# Patient Record
Sex: Male | Born: 1945 | Hispanic: No | Marital: Married | Smoking: Former smoker
Health system: Southern US, Community
[De-identification: ages and names within clinical notes are randomized; demographics above are authoritative.]

## PROBLEM LIST (undated history)

## (undated) DIAGNOSIS — E785 Hyperlipidemia, unspecified: Secondary | ICD-10-CM

## (undated) DIAGNOSIS — I8390 Asymptomatic varicose veins of unspecified lower extremity: Secondary | ICD-10-CM

## (undated) DIAGNOSIS — K635 Polyp of colon: Secondary | ICD-10-CM

## (undated) DIAGNOSIS — K219 Gastro-esophageal reflux disease without esophagitis: Secondary | ICD-10-CM

## (undated) DIAGNOSIS — K2970 Gastritis, unspecified, without bleeding: Secondary | ICD-10-CM

## (undated) DIAGNOSIS — K5790 Diverticulosis of intestine, part unspecified, without perforation or abscess without bleeding: Secondary | ICD-10-CM

## (undated) DIAGNOSIS — R51 Headache: Secondary | ICD-10-CM

## (undated) DIAGNOSIS — H3530 Unspecified macular degeneration: Secondary | ICD-10-CM

## (undated) DIAGNOSIS — I2510 Atherosclerotic heart disease of native coronary artery without angina pectoris: Secondary | ICD-10-CM

## (undated) HISTORY — PX: CARDIAC CATHETERIZATION: SHX172

## (undated) HISTORY — DX: Gastritis, unspecified, without bleeding: K29.70

## (undated) HISTORY — DX: Atherosclerotic heart disease of native coronary artery without angina pectoris: I25.10

## (undated) HISTORY — DX: Asymptomatic varicose veins of unspecified lower extremity: I83.90

## (undated) HISTORY — PX: OTHER SURGICAL HISTORY: SHX169

## (undated) HISTORY — DX: Diverticulosis of intestine, part unspecified, without perforation or abscess without bleeding: K57.90

## (undated) HISTORY — DX: Polyp of colon: K63.5

## (undated) HISTORY — PX: CORONARY ARTERY BYPASS GRAFT: SHX141

## (undated) HISTORY — DX: Hyperlipidemia, unspecified: E78.5

---

## 2001-04-18 LAB — HM COLONOSCOPY

## 2001-04-19 ENCOUNTER — Encounter: Payer: Self-pay | Admitting: Internal Medicine

## 2001-04-19 ENCOUNTER — Ambulatory Visit: Admission: RE | Admit: 2001-04-19 | Discharge: 2001-04-19 | Payer: Self-pay | Admitting: Internal Medicine

## 2001-10-19 ENCOUNTER — Encounter: Payer: Self-pay | Admitting: Emergency Medicine

## 2001-10-19 ENCOUNTER — Emergency Department (HOSPITAL_COMMUNITY): Admission: EM | Admit: 2001-10-19 | Discharge: 2001-10-19 | Payer: Self-pay | Admitting: Emergency Medicine

## 2002-06-12 ENCOUNTER — Encounter: Admission: RE | Admit: 2002-06-12 | Discharge: 2002-06-27 | Payer: Self-pay | Admitting: Internal Medicine

## 2002-11-24 ENCOUNTER — Encounter: Admission: RE | Admit: 2002-11-24 | Discharge: 2002-11-24 | Payer: Self-pay | Admitting: Internal Medicine

## 2002-11-24 ENCOUNTER — Encounter: Payer: Self-pay | Admitting: Internal Medicine

## 2004-07-04 ENCOUNTER — Encounter: Admission: RE | Admit: 2004-07-04 | Discharge: 2004-07-04 | Payer: Self-pay | Admitting: Internal Medicine

## 2004-07-08 ENCOUNTER — Encounter: Admission: RE | Admit: 2004-07-08 | Discharge: 2004-10-06 | Payer: Self-pay | Admitting: Internal Medicine

## 2004-08-12 ENCOUNTER — Encounter: Payer: Self-pay | Admitting: Internal Medicine

## 2004-08-15 ENCOUNTER — Encounter: Admission: RE | Admit: 2004-08-15 | Discharge: 2004-08-15 | Payer: Self-pay | Admitting: Internal Medicine

## 2004-10-30 ENCOUNTER — Ambulatory Visit: Payer: Self-pay | Admitting: Internal Medicine

## 2004-11-26 ENCOUNTER — Ambulatory Visit: Payer: Self-pay | Admitting: Internal Medicine

## 2005-10-09 ENCOUNTER — Ambulatory Visit: Payer: Self-pay | Admitting: Internal Medicine

## 2005-10-16 ENCOUNTER — Ambulatory Visit: Payer: Self-pay

## 2005-10-29 ENCOUNTER — Ambulatory Visit: Payer: Self-pay | Admitting: Internal Medicine

## 2005-12-01 ENCOUNTER — Ambulatory Visit: Payer: Self-pay | Admitting: Internal Medicine

## 2005-12-04 ENCOUNTER — Encounter: Admission: RE | Admit: 2005-12-04 | Discharge: 2005-12-04 | Payer: Self-pay | Admitting: Internal Medicine

## 2006-08-05 ENCOUNTER — Ambulatory Visit: Payer: Self-pay | Admitting: Internal Medicine

## 2006-08-11 ENCOUNTER — Ambulatory Visit: Payer: Self-pay | Admitting: Internal Medicine

## 2006-09-07 ENCOUNTER — Ambulatory Visit: Payer: Self-pay | Admitting: Internal Medicine

## 2006-12-21 DIAGNOSIS — K635 Polyp of colon: Secondary | ICD-10-CM

## 2006-12-21 HISTORY — DX: Polyp of colon: K63.5

## 2006-12-21 HISTORY — PX: COLONOSCOPY W/ POLYPECTOMY: SHX1380

## 2008-01-13 ENCOUNTER — Ambulatory Visit: Payer: Self-pay | Admitting: Internal Medicine

## 2008-01-14 LAB — CONVERTED CEMR LAB
Alkaline Phosphatase: 48 units/L (ref 39–117)
Basophils Absolute: 0 10*3/uL (ref 0.0–0.1)
Bilirubin, Direct: 0.1 mg/dL (ref 0.0–0.3)
Direct LDL: 199.2 mg/dL
Eosinophils Relative: 2.1 % (ref 0.0–5.0)
HDL: 39.1 mg/dL (ref >39.0)
Lymphocytes Relative: 35.5 % (ref 12.0–46.0)
MCV: 86.7 fL (ref 78.0–100.0)
Monocytes Absolute: 0.7 10*3/uL (ref 0.2–0.7)
Monocytes Relative: 8.8 % (ref 3.0–11.0)
Neutro Abs: 4.5 10*3/uL (ref 1.4–7.7)
Platelets: 223 10*3/uL (ref 150–400)
Total Bilirubin: 1 mg/dL (ref 0.3–1.2)
VLDL: 24 mg/dL (ref 0–40)
WBC: 8.4 10*3/uL (ref 4.5–10.5)

## 2008-01-16 ENCOUNTER — Encounter (INDEPENDENT_AMBULATORY_CARE_PROVIDER_SITE_OTHER): Payer: Self-pay | Admitting: *Deleted

## 2008-01-19 ENCOUNTER — Ambulatory Visit: Payer: Self-pay | Admitting: Internal Medicine

## 2008-01-20 ENCOUNTER — Ambulatory Visit: Payer: Self-pay | Admitting: Cardiology

## 2008-01-20 ENCOUNTER — Ambulatory Visit: Payer: Self-pay | Admitting: Internal Medicine

## 2008-01-20 LAB — CONVERTED CEMR LAB: BUN: 15 mg/dL (ref 6–23)

## 2008-01-23 ENCOUNTER — Telehealth (INDEPENDENT_AMBULATORY_CARE_PROVIDER_SITE_OTHER): Payer: Self-pay | Admitting: *Deleted

## 2008-02-02 ENCOUNTER — Encounter: Payer: Self-pay | Admitting: Internal Medicine

## 2008-03-09 ENCOUNTER — Ambulatory Visit: Payer: Self-pay | Admitting: Internal Medicine

## 2008-03-09 DIAGNOSIS — E785 Hyperlipidemia, unspecified: Secondary | ICD-10-CM

## 2008-03-09 LAB — CONVERTED CEMR LAB: Cholesterol, target level: 200 mg/dL

## 2008-03-15 ENCOUNTER — Encounter (INDEPENDENT_AMBULATORY_CARE_PROVIDER_SITE_OTHER): Payer: Self-pay | Admitting: *Deleted

## 2008-03-23 ENCOUNTER — Encounter (INDEPENDENT_AMBULATORY_CARE_PROVIDER_SITE_OTHER): Payer: Self-pay | Admitting: *Deleted

## 2008-04-19 ENCOUNTER — Encounter: Payer: Self-pay | Admitting: Internal Medicine

## 2008-04-20 ENCOUNTER — Encounter: Payer: Self-pay | Admitting: Internal Medicine

## 2008-04-22 ENCOUNTER — Encounter: Payer: Self-pay | Admitting: Internal Medicine

## 2008-04-23 ENCOUNTER — Encounter: Payer: Self-pay | Admitting: Internal Medicine

## 2008-04-28 ENCOUNTER — Encounter: Payer: Self-pay | Admitting: Internal Medicine

## 2008-05-17 ENCOUNTER — Encounter: Payer: Self-pay | Admitting: Internal Medicine

## 2008-05-24 ENCOUNTER — Encounter (HOSPITAL_COMMUNITY): Admission: RE | Admit: 2008-05-24 | Discharge: 2008-08-22 | Payer: Self-pay | Admitting: Cardiology

## 2008-05-25 ENCOUNTER — Encounter: Payer: Self-pay | Admitting: Internal Medicine

## 2008-05-25 ENCOUNTER — Telehealth (INDEPENDENT_AMBULATORY_CARE_PROVIDER_SITE_OTHER): Payer: Self-pay | Admitting: *Deleted

## 2008-06-11 ENCOUNTER — Encounter: Payer: Self-pay | Admitting: Internal Medicine

## 2008-06-28 ENCOUNTER — Encounter: Payer: Self-pay | Admitting: Internal Medicine

## 2008-07-05 ENCOUNTER — Ambulatory Visit: Payer: Self-pay | Admitting: Internal Medicine

## 2008-07-05 DIAGNOSIS — I2510 Atherosclerotic heart disease of native coronary artery without angina pectoris: Secondary | ICD-10-CM | POA: Insufficient documentation

## 2008-07-05 DIAGNOSIS — D126 Benign neoplasm of colon, unspecified: Secondary | ICD-10-CM

## 2008-07-06 ENCOUNTER — Ambulatory Visit: Payer: Self-pay | Admitting: Internal Medicine

## 2008-07-10 ENCOUNTER — Encounter (INDEPENDENT_AMBULATORY_CARE_PROVIDER_SITE_OTHER): Payer: Self-pay | Admitting: *Deleted

## 2008-07-16 LAB — CONVERTED CEMR LAB
Bilirubin, Direct: 0.1 mg/dL (ref 0.0–0.3)
Cholesterol: 142 mg/dL (ref 0–200)
Total CHOL/HDL Ratio: 3.6
Triglycerides: 79 mg/dL (ref 0–149)
VLDL: 16 mg/dL (ref 0–40)

## 2008-08-17 ENCOUNTER — Telehealth (INDEPENDENT_AMBULATORY_CARE_PROVIDER_SITE_OTHER): Payer: Self-pay | Admitting: *Deleted

## 2008-09-20 ENCOUNTER — Telehealth (INDEPENDENT_AMBULATORY_CARE_PROVIDER_SITE_OTHER): Payer: Self-pay | Admitting: *Deleted

## 2008-09-20 ENCOUNTER — Ambulatory Visit: Payer: Self-pay | Admitting: Internal Medicine

## 2008-09-21 ENCOUNTER — Telehealth: Payer: Self-pay | Admitting: Internal Medicine

## 2008-09-21 ENCOUNTER — Ambulatory Visit: Payer: Self-pay | Admitting: Internal Medicine

## 2008-11-29 ENCOUNTER — Ambulatory Visit: Payer: Self-pay | Admitting: Internal Medicine

## 2008-11-29 DIAGNOSIS — K5730 Diverticulosis of large intestine without perforation or abscess without bleeding: Secondary | ICD-10-CM | POA: Insufficient documentation

## 2008-12-04 ENCOUNTER — Ambulatory Visit: Payer: Self-pay | Admitting: Internal Medicine

## 2008-12-05 ENCOUNTER — Encounter (INDEPENDENT_AMBULATORY_CARE_PROVIDER_SITE_OTHER): Payer: Self-pay | Admitting: *Deleted

## 2008-12-08 LAB — CONVERTED CEMR LAB
AST: 33 units/L (ref 0–37)
Albumin: 3.9 g/dL (ref 3.5–5.2)
Alkaline Phosphatase: 46 units/L (ref 39–117)
BUN: 24 mg/dL — ABNORMAL HIGH (ref 6–23)
CO2: 28 meq/L (ref 19–32)
Calcium: 9.2 mg/dL (ref 8.4–10.5)
Cholesterol: 168 mg/dL (ref 0–200)
Creatinine, Ser: 1 mg/dL (ref 0.4–1.5)
Eosinophils Absolute: 0.3 10*3/uL (ref 0.0–0.7)
Eosinophils Relative: 3.5 % (ref 0.0–5.0)
HCT: 41.4 % (ref 39.0–52.0)
LDL Cholesterol: 105 mg/dL — ABNORMAL HIGH (ref 0–99)
Lymphocytes Relative: 34.3 % (ref 12.0–46.0)
MCHC: 34.2 g/dL (ref 30.0–36.0)
Monocytes Relative: 9.8 % (ref 3.0–12.0)
Neutrophils Relative %: 51.9 % (ref 43.0–77.0)
PSA: 0.5 ng/mL (ref 0.10–4.00)
Platelets: 221 10*3/uL (ref 150–400)
Sodium: 142 meq/L (ref 135–145)
TSH: 1.43 microintl units/mL (ref 0.35–5.50)
Total CHOL/HDL Ratio: 3.7
Total Protein: 7.3 g/dL (ref 6.0–8.3)
WBC: 7.4 10*3/uL (ref 4.5–10.5)

## 2008-12-24 ENCOUNTER — Encounter: Payer: Self-pay | Admitting: Internal Medicine

## 2009-03-04 ENCOUNTER — Ambulatory Visit: Payer: Self-pay | Admitting: Internal Medicine

## 2009-03-10 LAB — CONVERTED CEMR LAB
HDL: 38.3 mg/dL — ABNORMAL LOW (ref >39.0)
VLDL: 28 mg/dL (ref 0–40)

## 2009-03-12 ENCOUNTER — Encounter (INDEPENDENT_AMBULATORY_CARE_PROVIDER_SITE_OTHER): Payer: Self-pay | Admitting: *Deleted

## 2009-04-01 ENCOUNTER — Encounter: Payer: Self-pay | Admitting: Internal Medicine

## 2009-04-10 ENCOUNTER — Ambulatory Visit: Payer: Self-pay | Admitting: Internal Medicine

## 2009-04-10 DIAGNOSIS — I10 Essential (primary) hypertension: Secondary | ICD-10-CM | POA: Insufficient documentation

## 2009-09-26 ENCOUNTER — Telehealth: Payer: Self-pay | Admitting: Internal Medicine

## 2009-09-30 ENCOUNTER — Ambulatory Visit: Payer: Self-pay | Admitting: Internal Medicine

## 2009-09-30 DIAGNOSIS — I8390 Asymptomatic varicose veins of unspecified lower extremity: Secondary | ICD-10-CM | POA: Insufficient documentation

## 2009-09-30 DIAGNOSIS — M5137 Other intervertebral disc degeneration, lumbosacral region: Secondary | ICD-10-CM

## 2009-10-01 ENCOUNTER — Ambulatory Visit: Payer: Self-pay | Admitting: Internal Medicine

## 2009-10-01 LAB — CONVERTED CEMR LAB
Bilirubin Urine: NEGATIVE
Blood in Urine, dipstick: NEGATIVE
Glucose, Urine, Semiquant: NEGATIVE
Ketones, urine, test strip: NEGATIVE
Nitrite: NEGATIVE
Protein, U semiquant: NEGATIVE
Specific Gravity, Urine: 1.015
Urobilinogen, UA: 0.2
WBC Urine, dipstick: NEGATIVE
pH: 6.5

## 2009-10-02 ENCOUNTER — Encounter (INDEPENDENT_AMBULATORY_CARE_PROVIDER_SITE_OTHER): Payer: Self-pay | Admitting: *Deleted

## 2009-10-03 LAB — CONVERTED CEMR LAB
ALT: 27 U/L
AST: 24 U/L
Albumin: 4.1 g/dL
Alkaline Phosphatase: 36 U/L — ABNORMAL LOW
BUN: 14 mg/dL
Basophils Absolute: 0 K/uL
Basophils Relative: 0.5 %
Bilirubin, Direct: 0 mg/dL
CO2: 28 meq/L
Calcium: 9.6 mg/dL
Chloride: 105 meq/L
Cholesterol: 138 mg/dL
Creatinine, Ser: 1.2 mg/dL
Eosinophils Absolute: 0.2 K/uL
Eosinophils Relative: 3 %
GFR calc non Af Amer: 64.72 mL/min
Glucose, Bld: 106 mg/dL — ABNORMAL HIGH
HCT: 40.9 %
HDL: 42.6 mg/dL
Hemoglobin: 13.8 g/dL
LDL Cholesterol: 78 mg/dL
Lymphocytes Relative: 27.8 %
Lymphs Abs: 2 K/uL
MCHC: 33.8 g/dL
MCV: 87.8 fL
Monocytes Absolute: 0.5 K/uL
Monocytes Relative: 7.1 %
Neutro Abs: 4.5 K/uL
Neutrophils Relative %: 61.6 %
PSA: 0.68 ng/mL
Platelets: 189 K/uL
Potassium: 5 meq/L
RBC: 4.66 M/uL
RDW: 12.2 %
Sodium: 143 meq/L
TSH: 1.16 u[IU]/mL
Total Bilirubin: 0.9 mg/dL
Total CHOL/HDL Ratio: 3
Total Protein: 7.6 g/dL
Triglycerides: 86 mg/dL
VLDL: 17.2 mg/dL
WBC: 7.2 10*3/microliter

## 2009-10-04 ENCOUNTER — Encounter (INDEPENDENT_AMBULATORY_CARE_PROVIDER_SITE_OTHER): Payer: Self-pay | Admitting: *Deleted

## 2009-10-04 LAB — CONVERTED CEMR LAB: Hgb A1c MFr Bld: 6 % (ref 4.6–6.5)

## 2009-10-07 ENCOUNTER — Encounter: Payer: Self-pay | Admitting: Internal Medicine

## 2009-10-21 ENCOUNTER — Encounter: Payer: Self-pay | Admitting: Internal Medicine

## 2009-11-07 ENCOUNTER — Ambulatory Visit: Payer: Self-pay | Admitting: Internal Medicine

## 2009-11-28 ENCOUNTER — Encounter: Payer: Self-pay | Admitting: Internal Medicine

## 2009-12-21 DIAGNOSIS — K5790 Diverticulosis of intestine, part unspecified, without perforation or abscess without bleeding: Secondary | ICD-10-CM

## 2009-12-21 HISTORY — DX: Diverticulosis of intestine, part unspecified, without perforation or abscess without bleeding: K57.90

## 2010-01-21 ENCOUNTER — Ambulatory Visit: Payer: Self-pay | Admitting: Internal Medicine

## 2010-01-21 LAB — CONVERTED CEMR LAB
Helicobacter Pylori Antibody-IgG: 4.6 — ABNORMAL HIGH
Troponin I: 0.1 ng/mL — ABNORMAL HIGH (ref <0.06)

## 2010-01-22 ENCOUNTER — Encounter: Payer: Self-pay | Admitting: Internal Medicine

## 2010-01-23 ENCOUNTER — Telehealth (INDEPENDENT_AMBULATORY_CARE_PROVIDER_SITE_OTHER): Payer: Self-pay | Admitting: *Deleted

## 2010-01-24 ENCOUNTER — Ambulatory Visit: Payer: Self-pay | Admitting: Internal Medicine

## 2010-01-24 DIAGNOSIS — A048 Other specified bacterial intestinal infections: Secondary | ICD-10-CM | POA: Insufficient documentation

## 2010-01-27 ENCOUNTER — Encounter: Payer: Self-pay | Admitting: Internal Medicine

## 2010-01-27 LAB — CONVERTED CEMR LAB
Relative Index: 1.5 (ref 0.0–2.5)
Total CK: 168 units/L (ref 7–232)

## 2010-01-29 ENCOUNTER — Ambulatory Visit: Payer: Self-pay | Admitting: Internal Medicine

## 2010-01-29 ENCOUNTER — Encounter (INDEPENDENT_AMBULATORY_CARE_PROVIDER_SITE_OTHER): Payer: Self-pay | Admitting: *Deleted

## 2010-01-29 LAB — CONVERTED CEMR LAB: OCCULT 3: NEGATIVE

## 2010-01-31 ENCOUNTER — Encounter: Payer: Self-pay | Admitting: Internal Medicine

## 2010-02-11 ENCOUNTER — Ambulatory Visit: Payer: Self-pay | Admitting: Internal Medicine

## 2010-02-11 DIAGNOSIS — M940 Chondrocostal junction syndrome [Tietze]: Secondary | ICD-10-CM | POA: Insufficient documentation

## 2010-02-11 DIAGNOSIS — K219 Gastro-esophageal reflux disease without esophagitis: Secondary | ICD-10-CM

## 2010-05-09 ENCOUNTER — Telehealth (INDEPENDENT_AMBULATORY_CARE_PROVIDER_SITE_OTHER): Payer: Self-pay | Admitting: *Deleted

## 2010-06-19 ENCOUNTER — Telehealth (INDEPENDENT_AMBULATORY_CARE_PROVIDER_SITE_OTHER): Payer: Self-pay | Admitting: *Deleted

## 2010-06-26 ENCOUNTER — Ambulatory Visit: Payer: Self-pay | Admitting: Internal Medicine

## 2010-06-26 LAB — CONVERTED CEMR LAB
Alkaline Phosphatase: 37 units/L — ABNORMAL LOW (ref 39–117)
BUN: 21 mg/dL (ref 6–23)
Basophils Absolute: 0 10*3/uL (ref 0.0–0.1)
Basophils Relative: 0.4 % (ref 0.0–3.0)
CO2: 26 meq/L (ref 19–32)
Calcium: 9.1 mg/dL (ref 8.4–10.5)
Creatinine, Ser: 1.1 mg/dL (ref 0.4–1.5)
Eosinophils Relative: 2.7 % (ref 0.0–5.0)
GFR calc non Af Amer: 75.33 mL/min (ref >60)
HCT: 40.8 % (ref 39.0–52.0)
Lymphocytes Relative: 28 % (ref 12.0–46.0)
MCHC: 34.4 g/dL (ref 30.0–36.0)
Monocytes Absolute: 0.8 10*3/uL (ref 0.1–1.0)
Monocytes Relative: 9.5 % (ref 3.0–12.0)
RBC: 4.65 M/uL (ref 4.22–5.81)
Sodium: 139 meq/L (ref 135–145)
WBC: 8.7 10*3/uL (ref 4.5–10.5)

## 2010-06-27 ENCOUNTER — Ambulatory Visit: Payer: Self-pay | Admitting: Internal Medicine

## 2010-06-27 DIAGNOSIS — R7309 Other abnormal glucose: Secondary | ICD-10-CM

## 2010-06-27 DIAGNOSIS — R55 Syncope and collapse: Secondary | ICD-10-CM | POA: Insufficient documentation

## 2010-07-28 ENCOUNTER — Encounter: Payer: Self-pay | Admitting: Internal Medicine

## 2010-10-20 ENCOUNTER — Encounter: Payer: Self-pay | Admitting: Internal Medicine

## 2010-12-11 ENCOUNTER — Ambulatory Visit: Payer: Self-pay | Admitting: Internal Medicine

## 2010-12-11 LAB — CONVERTED CEMR LAB
Bilirubin Urine: NEGATIVE
Glucose, Urine, Semiquant: NEGATIVE
Nitrite: NEGATIVE
Protein, U semiquant: NEGATIVE
pH: 5

## 2010-12-12 ENCOUNTER — Encounter: Payer: Self-pay | Admitting: Internal Medicine

## 2010-12-16 LAB — CONVERTED CEMR LAB
ALT: 30 units/L (ref 0–53)
AST: 26 units/L (ref 0–37)
Albumin: 3.9 g/dL (ref 3.5–5.2)
BUN: 17 mg/dL (ref 6–23)
Basophils Absolute: 0 10*3/uL (ref 0.0–0.1)
Basophils Relative: 0.5 % (ref 0.0–3.0)
CO2: 27 meq/L (ref 19–32)
Calcium: 9 mg/dL (ref 8.4–10.5)
Chloride: 105 meq/L (ref 96–112)
Eosinophils Absolute: 0.3 10*3/uL (ref 0.0–0.7)
HDL: 40.9 mg/dL (ref >39.00)
Hemoglobin: 14.3 g/dL (ref 13.0–17.0)
LDL Cholesterol: 83 mg/dL (ref 0–99)
Lymphocytes Relative: 25.9 % (ref 12.0–46.0)
Lymphs Abs: 2.1 10*3/uL (ref 0.7–4.0)
Monocytes Relative: 7.7 % (ref 3.0–12.0)
Neutro Abs: 5 10*3/uL (ref 1.4–7.7)
PSA: 0.66 ng/mL (ref 0.10–4.00)
Potassium: 4.9 meq/L (ref 3.5–5.1)
RDW: 13.5 % (ref 11.5–14.6)
TSH: 1.31 microintl units/mL (ref 0.35–5.50)
Total Bilirubin: 0.7 mg/dL (ref 0.3–1.2)
Total CHOL/HDL Ratio: 4
Triglycerides: 138 mg/dL (ref 0.0–149.0)
WBC: 8 10*3/uL (ref 4.5–10.5)

## 2010-12-17 ENCOUNTER — Ambulatory Visit: Payer: Self-pay | Admitting: Internal Medicine

## 2011-01-18 LAB — CONVERTED CEMR LAB: Hgb A1c MFr Bld: 6.1 % (ref 4.6–6.5)

## 2011-01-20 NOTE — Progress Notes (Signed)
Summary:  [facility]-   Phone Note Outgoing Call   Summary of Call: Triage Record Num: [id number] Operator: Revonda Humphrey Patient Name: Shane Walter Call Date & Time: 06/18/2010 6:10:02PM Patient Phone: [phone] PCP: Patient Gender: Male PCP Fax : Patient DOB: 05-12-1946 Practice Name: [facility] Reason for Call: Wife-Roxana calling from [place] about patient...conference call to patient. Had lunch about 1:30pm of greek salad of olives, onion, lettuce with added tuna and tuna was raw. Suddenly felt hot at 2:30pm, found face was red and had severe diarrhea. Walked to car with co-worker and drove home although eye sight was affected and not clear. After arriving at home had watery diarrhea again and went to bed. Slept. When woke could see well and went to bathroom again about 4:00pm, remembers turning on light then woke up on floor since had passed out - head landed on softer laundry hamper. Called brother who came and took BP 104/52 and gave him 7 up. Had diarrhea again the last time about 5:00pm and BP 97/48. Slept some and since awaking BP 131/60. Feels much better, normal color, has urinated. Gave care information with Sx to watch for especially that would require ER visit. Protocol(s) Used: Diarrhea / Change in Bowel Habits Recommended Outcome per Protocol: Provide Home/Self Care Reason for Outcome: Sudden onset of diarrhea, usually with abdominal pain, nausea and sometimes vomiting, occurring within 36 hours after eating unpasteurized, raw or undercooked foods OR drinking unpurified or nonchlorinated water Care Advice: Suspected Food-Borne Illness Care: - Drink 2-3 quarts (2-3 liters) of low sugar content fluids, including OTC oral hydration solution, per day unless directed otherwise by provider. - If accompanied by vomiting, take the fluids in frequent small sips or suck on ice chips. - Do not eat solid food until vomiting subsides. - Eat easily  digested foods (such as bananas, rice, applesauce, toast, cooked cereals, soup, crackers, baked or boiled potato, or baked chicken or turke  Follow-up for Phone Call        left message to call office to see how pt doing.........Marland KitchenFelecia Deloach CMA  June 19, 2010 8:22 AM   Additional Follow-up for Phone Call Additional follow up Details #1::        patient wife called to make appt with dr hopper - she said patient is feeling better & went to work today - appt scheduled next week - told patient wife if patient still not up to par to call before weekend .Marland KitchenOkey Regal Spring  June 19, 2010 10:11 AM      Additional Follow-up for Phone Call Additional follow up Details #2::    frecommend fasting labs then OV next day : CBC& dif, BMET, hepatic panel(Codes: N&V, syncope) Follow-up by: Marga Melnick MD,  June 20, 2010 8:24 AM  Additional Follow-up for Phone Call Additional follow up Details #3:: Details for Additional Follow-up Action Taken: spoke with patient scheduled lab [phone] .Marland KitchenOkey Regal Spring  June 25, 2010 11:39 AM

## 2011-01-20 NOTE — Assessment & Plan Note (Signed)
Summary: f/u fainted /bp low /cbs   Vital Signs:  Patient profile:   65 year old male Weight:      196.2 pounds BMI:     28.25 O2 Sat:      97 % Temp:     98.3 degrees F oral Pulse rate:   60 / minute Resp:     14 per minute BP sitting:   110 / 62  (left arm) Cuff size:   large  Vitals Entered By: Shonna Chock (June 27, 2010 9:12 AM) CC: F/U-patient fainted (not seen at ER or UC), Syncope Comments REVIEWED MED LIST, PATIENT AGREED DOSE AND INSTRUCTION CORRECT    CC:  F/U-patient fainted (not seen at ER or UC) and Syncope.  History of Present Illness:  Syncope      This is a 65 year old man who presents with Syncope 06/18/2010 ( See Nurse On Call Report).  The patient reports loss of consciousness for 1-2 min, premonitory symptoms of decreased  field of vision ? related to ptosis, and lightheadedness, but denies palpitations, chest pain, shortness of breath, seizures, and incontinence.  Associated symptoms include  headache over crown  subsequent to event.He denies abdominal discomfort,no  feeling warm.  The patient denies the following symptoms: nausea, vomiting, pallor ( head  & upper chest  were "red"), diaphoresis, focal weakness, perioral numbness, or  bite injury of tongue.  The loss of consciousness occurred in the setting of a recent meal, having eaten raw tuna 20 min prior to onset of symptoms.  Risk factors for syncope include coronary disease and antihypertensive medications( Beta blocker). No recurrence.   Labs reviewed : FBS 104.  Allergies: 1)  ! * Ivp Dye  Review of Systems ENT:  Denies difficulty swallowing and hoarseness. GI:  Denies bloody stools, dark tarry stools, and indigestion; Chronic , intermittent L lateral abd pain X 12 years. Neuro:  Denies numbness and tingling.  Physical Exam  General:  well-nourished,in no acute distress; alert,appropriate and cooperative throughout examination Eyes:  No corneal or conjunctival inflammation noted. EOMI. Perrla.  Field of  Vision grossly normal. No icterus. Arcus bilaterally Mouth:  Oral mucosa and oropharynx without lesions or exudates.  No tongue  deviation Lungs:  Normal respiratory effort, chest expands symmetrically. Lungs are clear to auscultation, no crackles or wheezes. Heart:  regular rhythm, no gallop, no rub, no JVD, no HJR, bradycardia, and grade 1 /6 systolic murmur.   Abdomen:  Bowel sounds positive,abdomen soft and non-tender without masses, organomegaly or hernias noted. Extremities:  No clubbing, cyanosis, edema. Neurologic:  alert & oriented X3, cranial nerves II-XII intact, strength normal in all extremities, sensation intact to light touch, gait normal, DTRs symmetrical and normal, finger-to-nose normal, heel-to-shin normal, and Romberg negative.  No pronator drift Skin:  Intact without suspicious lesions or rashes. No jaundice Cervical Nodes:  No lymphadenopathy noted Axillary Nodes:  No palpable lymphadenopathy Psych:  memory intact for recent and remote, normally interactive, and good eye contact.     Impression & Recommendations:  Problem # 1:  SYNCOPE (ICD-780.2)  in context of apparent food poisoning  Orders: EKG w/ Interpretation (93000)  Problem # 2:  CAD (ICD-414.00)  stable His updated medication list for this problem includes:    Toprol Xl 25 Mg Xr24h-tab (Metoprolol succinate) .Marland Kitchen... 1 by mouth two times a day    Lisinopril 10 Mg Tabs (Lisinopril) .Marland Kitchen... 1 by mouth once daily  Orders: EKG w/ Interpretation (93000)  Problem # 3:  HYPERGLYCEMIA, FASTING (ICD-790.29)  Orders: Venipuncture (16109) TLB-A1C / Hgb A1C (Glycohemoglobin) (83036-A1C)  Complete Medication List: 1)  Fish Oil  .... Qd 2)  Asa 81mg   .... 1 by mouth once daily 3)  Crestor 20 Mg Tabs (Rosuvastatin calcium) .Marland Kitchen.. 1 once daily 4)  Toprol Xl 25 Mg Xr24h-tab (Metoprolol succinate) .Marland Kitchen.. 1 by mouth two times a day 5)  Lisinopril 10 Mg Tabs (Lisinopril) .Marland Kitchen.. 1 by mouth once daily  Patient  Instructions: 1)  Be seen for any acute chest pain. Report any recurrence of symptoms.

## 2011-01-20 NOTE — Progress Notes (Signed)
Summary: Request for Labs  Phone Note Call from Patient Call back at [phone]   Caller: Patient Summary of Call: Message left on VM: patient would like to come by at 3pm and pick up labs.   Shane Walter  May 09, 2010 2:10 PM   Follow-up for Phone Call        Left message on machine for patient to return call when avaliable, Reason for call:    ? which labs, no recent labs Follow-up by: Shonna Chock,  May 09, 2010 2:12 PM  Additional Follow-up for Phone Call Additional follow up Details #1::        Patient called back to say his last CPX and labs is what he would like a copy of.   I placed copies at the front Additional Follow-up by: Shonna Chock,  May 09, 2010 3:31 PM

## 2011-01-20 NOTE — Assessment & Plan Note (Signed)
Summary: STOMACH PAIN/[facility]   Vital Signs:  Patient profile:   65 year old male Weight:      196.2 pounds BMI:     28.25 Temp:     98.6 degrees F oral Pulse rate:   52 / minute Resp:     16 per minute BP sitting:   122 / 70  (left arm) Cuff size:   large  Vitals Entered By: Shonna Chock (January 21, 2010 2:51 PM) CC: 1.) Stomach discomfort (mainly left side) all over x 2days.  2.) Pressure on chest, pain left side (near underarm), Abdominal pain   CC:  1.) Stomach discomfort (mainly left side) all over x 2days.  2.) Pressure on chest, pain left side (near underarm), and Abdominal pain.  History of Present Illness:  Abdominal Pain       The patient denies nausea, vomiting, diarrhea, constipation, melena, hematochezia, anorexia, and hematemesis.  The location of the pain is left upper quadrant.  The pain is described as constant and dull, pressure like.  Associated symptoms include chest pain.  The patient denies the following symptoms: fever, weight loss, dysuria, jaundice, and dark urine.         The patient also complains of Chest pain.  The patient reports resting chest pain, but denies  shortness of breath, palpitations, dizziness, light headedness, syncope, and indigestion.  .  The pain is located in the left anterior chest.  The pain radiates to the back in intrascapular area.  Episodes of chest pain last >30 minutes, ie "all day" 01/20/2010 with  sweating @ night.  The pain is relieved or improved with  resting, not worse with exertion .  Both abdominal pain & cp were better off caffeine, coffee 2-3 cups  / day & tea 5-6 cups / day. Intermittent L breast tenderness for years.  Allergies: 1)  ! * Ivp Dye  Review of Systems General:  Denies fatigue and weight loss. ENT:  Denies difficulty swallowing and hoarseness. Resp:  Denies chest pain with inspiration, coughing up blood, pleuritic, shortness of breath, sputum productive, and wheezing. GU:  Denies discharge and  hematuria. Derm:  Denies lesion(s) and rash. Neuro:  Denies numbness and tingling.  Physical Exam  General:  well-nourished,in no acute distress; alert,appropriate and cooperative throughout examination Eyes:  No corneal or conjunctival inflammation noted. Arcus senilis.Perrla.No icterus Mouth:  Oral mucosa and oropharynx without lesions or exudates.  Teeth in good repair. No pharyngeal erythema.   Chest Wall:  no tenderness.   Breasts:  Minimal fibrocystic changes Lungs:  Normal respiratory effort, chest expands symmetrically. Lungs are clear to auscultation, no crackles or wheezes. Heart:  Normal rate and regular rhythm. S1 and S2 normal without gallop, click, rub. S4  with grade 1 /6 systolic murmur.   Abdomen:  Bowel sounds positive,abdomen soft and non-tender without masses, organomegaly or hernias noted. Pulses:  R and L carotid,radial,dorsalis pedis and posterior tibial pulses are full and equal bilaterally Extremities:  No clubbing, cyanosis, edema.Neg Homan's Neurologic:  alert & oriented X3.   Skin:  Keloid op scars Cervical Nodes:  No lymphadenopathy noted Psych:  memory intact for recent and remote, normally interactive, and good eye contact.     Impression & Recommendations:  Problem # 1:  ABDOMINAL PAIN, LEFT UPPER QUADRANT (ICD-789.02)  ERD ; R/O hiatal hernia  Orders: Venipuncture (29528) T-Helicobacter AB - IgG (41324-40102)  Problem # 2:  CHEST PAIN (ICD-786.50)  see #1  Orders: EKG w/ Interpretation (93000) Venipuncture (  16109) TLB-Cardiac Panel [phone]) T-Troponin I (82956-21308)  Problem # 3:  CAD (ICD-414.00)  His updated medication list for this problem includes:    Toprol Xl 25 Mg Xr24h-tab (Metoprolol succinate) .Marland Kitchen... 1 by mouth two times a day    Lisinopril 10 Mg Tabs (Lisinopril) .Marland Kitchen... 1 by mouth once daily  Complete Medication List: 1)  Fish Oil  .... Qd 2)  Asa 81mg   .... 1 by mouth once daily 3)  Crestor 20 Mg Tabs  (Rosuvastatin calcium) .Marland Kitchen.. 1 once daily 4)  Toprol Xl 25 Mg Xr24h-tab (Metoprolol succinate) .Marland Kitchen.. 1 by mouth two times a day 5)  Lisinopril 10 Mg Tabs (Lisinopril) .Marland Kitchen.. 1 by mouth once daily 6)  Ranitidine Hcl 150 Mg Tabs (Ranitidine hcl) .Marland Kitchen.. 1 two times a day pre meal  Patient Instructions: 1)  Complete stool cards. 2)  Avoid foods high in acid (tomatoes, citrus juices, spicy foods). Avoid eating within two hours of lying down or before exercising. Do not over eat; try smaller more frequent meals. Elevate head of bed twelve inches when sleeping. Vitamin E 400 units once daily for breast tenderness Prescriptions: RANITIDINE HCL 150 MG TABS (RANITIDINE HCL) 1 two times a day pre meal  #60 x 2   Entered and Authorized by:   Marga Melnick MD   Signed by:   Marga Melnick MD on 01/21/2010   Method used:   Faxed to ...       [facility]  [address] [phone]* (retail)       [address]       [place], [place]  [address]       Ph: [phone]       Fax: [phone]   RxID:   [id number]

## 2011-01-20 NOTE — Letter (Signed)
Summary: [facility]  [facility]   Imported By: Lanelle Bal 11/11/2010 10:04:53  _____________________________________________________________________  External Attachment:    Type:   Image     Comment:   External Document

## 2011-01-20 NOTE — Progress Notes (Signed)
Summary: check on pt  Phone Note Outgoing Call   Call placed by: Doristine Devoid,  January 23, 2010 4:11 PM Call placed to: Patient Summary of Call: called to check on patient says he is feeling much better and let him know that hop would like to follow up with him so office visit scheduled for tomorrow .....Marland KitchenMarland KitchenDoristine Devoid  January 23, 2010 4:12 PM

## 2011-01-20 NOTE — Letter (Signed)
Summary: [facility]  [facility]   Imported By: Lanelle Bal 02/07/2010 13:37:55  _____________________________________________________________________  External Attachment:    Type:   Image     Comment:   External Document

## 2011-01-20 NOTE — Assessment & Plan Note (Signed)
Summary: Ongoing symptoms and discuss labs/scm   Vital Signs:  Patient profile:   65 year old male Weight:      195.4 pounds Temp:     98.2 degrees F oral Pulse rate:   64 / minute Resp:     15 per minute BP sitting:   100 / 60 Cuff size:   large  Vitals Entered By: Shonna Chock (February 11, 2010 1:48 PM) CC: Follow-up visit: Discuss labs, ongoing symptoms Comments REVIEWED MED LIST, PATIENT AGREED DOSE AND INSTRUCTION CORRECT    CC:  Follow-up visit: Discuss labs and ongoing symptoms.  History of Present Illness: Now major issue is Intermittent throbbing pain L anterior chest ; Dr Lorenda Peck changed Ranitidine to Omeprazole with resolution of "burning " sensation". Dr Debroah Loop note was reviewed; the focus of that OV was LLQ , not LUQ pain. Dr Debroah Loop note does not list Ranitidine . He took PPI X 2 weeks; Rx was written for 30 days. The patient reports resting chest pain, but denies exertional chest pain (chest pain resolves on treadmill), nausea, vomiting, diaphoresis, shortness of breath, palpitations, dizziness, light headedness, syncope, and indigestion.  The pain radiates to the back.  Episodes of chest pain last 3 minutes and 15  minutes.   Direct pressure improves pain. Ice water causes a burning  in L chest.  Allergies: 1)  ! * Ivp Dye  Review of Systems GI:  Complains of constipation; denies bloody stools, dark tarry stools, and indigestion. Derm:  Denies lesion(s) and rash; No PMH of shingles. Neuro:  Denies brief paralysis, numbness, tingling, tremors, and weakness.  Physical Exam  General:  well-nourished,in no acute distress; alert,appropriate and cooperative throughout examination Chest Wall:  costochondrial tenderness on L.   Lungs:  Normal respiratory effort, chest expands symmetrically. Lungs are clear to auscultation, no crackles or wheezes. Heart:  normal rate, regular rhythm, no gallop, no rub, no JVD, no HJR, and grade  1/6 systolic murmur.   Abdomen:   Bowel sounds positive,abdomen soft and non-tender without masses, organomegaly or hernias noted. Pulses:  R and L carotid,radial,dorsalis pedis and posterior tibial pulses are full and equal bilaterally Extremities:  No clubbing, cyanosis, edema Homan's negative  Neurologic:  alert & oriented X3, strength normal in all extremities, and DTRs symmetrical and normal.   Skin:  Keloid ant chest Psych:  memory intact for recent and remote, normally interactive, and good eye contact.     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 1 by mouth two times a day  Problem # 4:  CAD (ICD-414.00)  His updated medication list for this problem includes:    Toprol Xl 25 Mg Xr24h-tab (Metoprolol succinate) .Marland Kitchen... 1 by mouth two times a day    Lisinopril 10 Mg Tabs (Lisinopril) .Marland Kitchen... 1 by mouth once daily  Complete Medication List: 1)  Fish Oil  .... Qd 2)  Asa 81mg   .... 1 by mouth once daily 3)  Crestor 20 Mg Tabs (Rosuvastatin calcium) .Marland Kitchen.. 1 once daily 4)  Toprol Xl 25 Mg Xr24h-tab (Metoprolol succinate) .Marland Kitchen.. 1 by mouth two times a day 5)  Lisinopril 10 Mg Tabs (Lisinopril) .Marland Kitchen.. 1 by mouth once daily 6)  Omeprazole 20 Mg Tbec (Omeprazole) .Marland Kitchen.. 1 by mouth two  times a day 7)  Pepto-bismol 262 Mg Chew (Bismuth subsalicylate) .... 2 pills 4x / day x 14 days  Patient Instructions: 1)  Avoid foods high in acid (tomatoes, citrus juices, spicy foods). Avoid eating within two hours of lying down or before exercising. Do not over eat; try smaller more frequent meals. Elevate head of bed twelve inches when sleeping. Take Omeprazole two times a day X 8 weeks total then take it once daily as needed . See Dr Lorenda Peck if no better. Warm compresses or heating pad as  needed for Costochondritis (Tietze's) Prescriptions: OMEPRAZOLE 20 MG TBEC (OMEPRAZOLE) 1 by mouth two times a day  #60 x 0   Entered and Authorized by:   Marga Melnick MD   Signed by:   Marga Melnick MD on 02/11/2010   Method used:   Print then Give to Patient   RxID:   [id number]

## 2011-01-20 NOTE — Letter (Signed)
Summary: [facility]  [facility]   Imported By: Lanelle Bal 02/06/2010 08:03:36  _____________________________________________________________________  External Attachment:    Type:   Image     Comment:   External Document

## 2011-01-20 NOTE — Letter (Signed)
Summary: [facility]  [facility]   Imported By: Lanelle Bal 08/07/2010 10:50:15  _____________________________________________________________________  External Attachment:    Type:   Image     Comment:   External Document

## 2011-01-20 NOTE — Letter (Signed)
Summary: Results Follow up Letter  [facility]  [address] [place], [place] [address]   Phone: [phone]  Fax: [phone]    01/29/2010 MRN: [id number]  Shane Walter [address] [place], [place]  [address]  Dear Shane Walter,  The following are the results of your recent test(s):  Test         Result    Pap Smear:        Normal _____  Not Normal _____ Comments: ______________________________________________________ Cholesterol: LDL(Bad cholesterol):         Your goal is less than:         HDL (Good cholesterol):       Your goal is more than: Comments:  ______________________________________________________ Mammogram:        Normal _____  Not Normal _____ Comments:  ___________________________________________________________________ Hemoccult:        Normal _X____  Not normal _______ Comments:    _____________________________________________________________________ Other Tests:    We routinely do not discuss normal results over the telephone.  If you desire a copy of the results, or you have any questions about this information we can discuss them at your next office visit.   Sincerely,

## 2011-01-20 NOTE — Assessment & Plan Note (Signed)
Summary: ROA PER HOP/CDJ   Vital Signs:  Patient profile:   65 year old male Weight:      196 pounds Pulse rate:   68 / minute Resp:     16 per minute BP sitting:   130 / 72  (left arm)  Vitals Entered By: Jeremy Johann CMA (January 24, 2010 12:36 PM)  History of Present Illness: Chest pain has resolved ,but he has some intermittent  LUQ pain. Labs reviewed & significance of  +H.pylori serology(4.6 with negative < 0.90) discussed. Troponin 1 was 0.1(< 0.06) but CPK & MB were normal. No PMH of Endoscopy but Dr Lorenda Peck , [facility] performed a colonoscopy.  Allergies: 1)  ! * Ivp Dye  Review of Systems General:  Denies fever, sweats, and weight loss. ENT:  Denies difficulty swallowing and hoarseness. CV:  Denies chest pain or discomfort, leg cramps with exertion, palpitations, and shortness of breath with exertion. GI:  Denies bloody stools, dark tarry stools, gas, and indigestion.  Physical Exam  General:  well-nourished,in no  distress; alert,appropriate and cooperative throughout examination Mouth:  Oral mucosa and oropharynx without lesions or exudates.  Teeth in good repair. No pharyngeal erythema.   Heart:  regular rhythm and bradycardia.  S4 with slurring Abdomen:  Bowel sounds positive,abdomen soft  but slightly tender  in mid abdomen without masses, organomegaly or hernias noted.   Impression & Recommendations:  Problem # 1:  ABDOMINAL PAIN, LEFT UPPER QUADRANT (ICD-789.02)  Orders: Gastroenterology Referral (GI)  Problem # 2:  HELICOBACTER PYLORI GASTRITIS (ICD-041.86)  Orders: Gastroenterology Referral (GI)  Problem # 3:  CHEST PAIN (ICD-786.50)  resolved; non specific elevation of Troponin 1 with normal CPK & MB  Orders: Venipuncture (16109) TLB-Cardiac Panel ([id number]) T-Troponin I (82956-21308)  Complete Medication List: 1)  Fish Oil  .... Qd 2)  Asa 81mg   .... 1 by mouth once daily 3)  Crestor 20 Mg Tabs (Rosuvastatin calcium) .Marland Kitchen.. 1 once  daily 4)  Toprol Xl 25 Mg Xr24h-tab (Metoprolol succinate) .Marland Kitchen.. 1 by mouth two times a day 5)  Lisinopril 10 Mg Tabs (Lisinopril) .Marland Kitchen.. 1 by mouth once daily 6)  Ranitidine Hcl 150 Mg Tabs (Ranitidine hcl) .Marland Kitchen.. 1 two times a day pre meal 7)  Tetracycline Hcl 500 Mg Caps (Tetracycline hcl) .Marland Kitchen.. 1 two times a day 8)  Metronidazole 250 Mg Tabs (Metronidazole) .Marland Kitchen.. 1 qid 9)  Pepto-bismol 262 Mg Chew (Bismuth subsalicylate) .... 2 pills 4x / day x 14 days  Patient Instructions: 1)  Continue Ranitidine 150 mg two times a day . Complete stool cards Prescriptions: PEPTO-BISMOL 262 MG CHEW (BISMUTH SUBSALICYLATE) 2 pills 4X / day X 14 days  #112 x 0   Entered and Authorized by:   Marga Melnick MD   Signed by:   Marga Melnick MD on 01/24/2010   Method used:   Faxed to ...       [facility]  [address] [phone]* (retail)       [address]       [place], [place]  [address]       Ph: [phone]       Fax: [phone]   RxID:   [id number] METRONIDAZOLE 250 MG TABS (METRONIDAZOLE) 1 qid  #56 x 0   Entered and Authorized by:   Marga Melnick MD   Signed by:   Marga Melnick MD on 01/24/2010   Method used:   Faxed to ...       [facility]  [address] [phone]* (retail)       [address]       [place], [place]  [address]       Ph: [phone]       Fax: [phone]   RxID:   [id number] TETRACYCLINE HCL 500 MG CAPS (TETRACYCLINE HCL) 1 two times a day  #28 x 0   Entered and Authorized by:   Marga Melnick MD   Signed by:   Marga Melnick MD on 01/24/2010   Method used:   Faxed to ...       [facility]  [address] [phone]* (retail)       [address]       [place], [place]  [address]       Ph: [phone]       Fax: [phone]   RxID:   [id number]

## 2011-01-22 NOTE — Assessment & Plan Note (Signed)
Summary: YEARLY,UMR,LABS PRIOR/RH..........Marland Kitchen   Vital Signs:  Patient profile:   65 year old male Height:      70 inches Weight:      201 pounds BMI:     28.94 Temp:     98.4 degrees F oral Pulse rate:   64 / minute Resp:     14 per minute BP sitting:   132 / 80  (left arm) Cuff size:   large  Vitals Entered By: Shonna Chock CMA (December 17, 2010 3:34 PM) CC: CPX and discuss lab (copy given)    CC:  CPX and discuss lab (copy given) .  History of Present Illness:    Shane Walter  is here for a physical; he is asymptomatic.He is still employed full time ;his primary policy is NOT Medicare.    Hyperlipidemia Follow-Up:   The patient denies muscle aches, GI upset, abdominal pain, flushing, itching, constipation, diarrhea, and fatigue.  The patient denies the following symptoms: chest pain/pressure, exercise intolerance, dypsnea, palpitations, syncope, and pedal edema.  Compliance with medications (by patient report) has been near 100%.  Dietary compliance has been excellent.  The patient reports exercising  4-5 X per week as cardio, machines  & weights.  Adjunctive measures currently used by the patient include ASA and fish oil supplements.      Hypertension Follow-Up:   The patient denies lightheadedness, urinary frequency, and headaches.  Compliance with medications (by patient report) has been near 100%.  Adjunctive measures currently used by the patient include salt restriction.  BP  not measured @ home.  Current Medications (verified): 1)  Fish Oil .... Qd 2)  Asa 81mg  .... 1 By Mouth Once Daily 3)  Crestor 20 Mg  Tabs (Rosuvastatin Calcium) .Marland Kitchen.. 1 Once Daily **labs Due** 4)  Toprol Xl 25 Mg  Xr24h-Tab (Metoprolol Succinate) .Marland Kitchen.. 1 By Mouth Two Times A Day 5)  Lisinopril 10 Mg Tabs (Lisinopril) .Marland Kitchen.. 1 By Mouth Once Daily  Allergies: 1)  ! * Ivp Dye  Past History:  Past Medical History: Varicose Veins DIVERTICULITIS OF COLON (ICD-562.11), recurrent HYPERLIPIDEMIA (ICD-272.4):  NMR Lipoprofile 2005: LDL 219( 3085/2530), HDL 44,TG 129.  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  three vessel, [facility], Dr Ty Hilts Colon polypectomy 2008 ,[facility], due 2013  Family History: no CAD, MI, CVA paternal side unknown ; Mother died  96;1/2 bro :CBAG; longevity  is typical among both sides  Social History: Former Smoker: quit 10/21/1976 Occupation: Technical brewer Married Alcohol use-yes: 1 wine nightly Regular exercise-yes: CVE 4-5 X/week X 45-60    Review of Systems  The patient denies anorexia, fever, vision loss, decreased hearing, hoarseness, prolonged cough, hemoptysis, melena, hematochezia, severe indigestion/heartburn, hematuria, suspicious skin lesions, depression, unusual weight change, abnormal bleeding, enlarged lymph nodes, and angioedema.         weight up 8 # over holidays.  Physical Exam  General:  well-nourished;alert,appropriate and cooperative throughout examination Head:  Normocephalic and atraumatic without obvious abnormalities. No apparent alopecia . Eyes:  No corneal or conjunctival inflammation noted. Perrla. Funduscopic exam benign, without hemorrhages, exudates or papilledema. Ears:  External ear exam shows no significant lesions or deformities.  Otoscopic examination reveals clear canals, tympanic membranes are intact bilaterally without bulging, retraction, inflammation or discharge. Hearing is grossly normal bilaterally. Nose:  External nasal examination shows no deformity or inflammation. Nasal mucosa are  dry  without lesions or exudates. Mouth:  Oral mucosa and oropharynx without lesions or exudates.  Teeth in good repair. Neck:  No deformities, masses, or tenderness noted. Lungs:  Normal respiratory effort, chest expands symmetrically. Lungs are clear to  auscultation, no crackles or wheezes. Heart:  normal rate, regular rhythm, no gallop, no rub, no JVD, no HJR, and grade 1/2-1 /6 systolic murmur.   Abdomen:  Bowel sounds positive,abdomen soft and non-tender without masses, organomegaly or hernias noted. Rectal:  declined ; he wants to monitor PSA Msk:  No deformity or scoliosis noted of thoracic or lumbar spine.   Pulses:  R and L carotid,radial,dorsalis pedis and posterior tibial pulses are full and equal bilaterally Extremities:  No clubbing, cyanosis, edema, or deformity noted with normal full range of motion of all joints.  Mid crepitus of knees  Neurologic:  alert & oriented X3 and DTRs symmetrical and normal.   Skin:  Intact without suspicious lesions or rashes Cervical Nodes:  No lymphadenopathy noted Axillary Nodes:  No palpable lymphadenopathy Inguinal Nodes:  No significant adenopathy Psych:  memory intact for recent and remote, normally interactive, and good eye contact.   Focused & intelligent   Impression & Recommendations:  Problem # 1:  ROUTINE GENERAL MEDICAL EXAM@HEALTH  CARE FACL (ICD-V70.0)  Problem # 2:  CAD (ICD-414.00)  His updated medication list for this problem includes:    Toprol Xl 25 Mg Xr24h-tab (Metoprolol succinate) .Marland Kitchen... 1 by mouth two times a day    Lisinopril 10 Mg Tabs (Lisinopril) .Marland Kitchen... 1 by mouth once daily  Problem # 3:  HYPERLIPIDEMIA (ICD-272.4)  His updated medication list for this problem includes:    Crestor 20 Mg Tabs (Rosuvastatin calcium) .Marland Kitchen... 1 once daily  Problem # 4:  PRE-DIABETES (ICD-790.29) A1c 6.3%  Problem # 5:  UNSPECIFIED ESSENTIAL HYPERTENSION (ICD-401.9) controlled His updated medication list for this problem includes:    Toprol Xl 25 Mg Xr24h-tab (Metoprolol succinate) .Marland Kitchen... 1 by mouth two times a day    Lisinopril 10 Mg Tabs (Lisinopril) .Marland Kitchen... 1 by mouth once daily  Complete Medication List: 1)  Fish Oil  .... Qd 2)  Asa 81mg   .... 1 by mouth once daily 3)  Crestor  20 Mg Tabs (Rosuvastatin calcium) .Marland Kitchen.. 1 once daily 4)  Toprol Xl 25 Mg Xr24h-tab (Metoprolol succinate) .Marland Kitchen.. 1 by mouth two times a day 5)  Lisinopril 10 Mg Tabs (Lisinopril) .Marland Kitchen.. 1 by mouth once daily  Patient Instructions: 1)  Low carb program such as The [address] Sugar [address] or [place]. Avoid sugar from High Fructose Corn Syrup as discussed. 2)  Please schedule a follow-up appointment in 4 months. 3)  HbgA1C prior to visit, ICD-9:790.29 4)  Urine Microalbumin prior to visit, ICD-9:790.29 Prescriptions: LISINOPRIL 10 MG TABS (LISINOPRIL) 1 by mouth once daily  #90 x 3   Entered and Authorized by:   Marga Melnick MD   Signed by:   Marga Melnick MD on 12/17/2010   Method used:   Print then Give to Patient   RxID:   [id number] TOPROL XL 25 MG  XR24H-TAB (METOPROLOL SUCCINATE) 1 by mouth two times a day  #180 x 3   Entered and Authorized by:   Marga Melnick MD   Signed by:   Marga Melnick MD on 12/17/2010   Method used:   Print then Give to Patient   RxID:   [id number] CRESTOR 20 MG  TABS (ROSUVASTATIN CALCIUM) 1 once daily  #903 x 0   Entered  and Authorized by:   Marga Melnick MD   Signed by:   Marga Melnick MD on 12/17/2010   Method used:   Print then Give to Patient   RxID:   [id number]    Orders Added: 1)  Est. Patient 65& > [56213]     Appended Document: YEARLY,UMR,LABS PRIOR/RH........... Prescriptions: CRESTOR 20 MG  TABS (ROSUVASTATIN CALCIUM) 1 once daily  #90 x 3   Entered by:   Shonna Chock CMA   Authorized by:   Marga Melnick MD   Signed by:   Shonna Chock CMA on 12/17/2010   Method used:   Faxed to ...       [facility]  [address] [phone]* (retail)       [address]       [place], [place]  [address]       Ph: [phone]       Fax: [phone]   RxID:   [id number]

## 2011-03-17 ENCOUNTER — Other Ambulatory Visit: Payer: Self-pay | Admitting: Internal Medicine

## 2011-04-02 ENCOUNTER — Encounter: Payer: Self-pay | Admitting: Internal Medicine

## 2011-04-02 ENCOUNTER — Ambulatory Visit (INDEPENDENT_AMBULATORY_CARE_PROVIDER_SITE_OTHER): Payer: Commercial Managed Care - PPO | Admitting: Internal Medicine

## 2011-04-02 DIAGNOSIS — E785 Hyperlipidemia, unspecified: Secondary | ICD-10-CM

## 2011-04-02 DIAGNOSIS — M549 Dorsalgia, unspecified: Secondary | ICD-10-CM

## 2011-04-02 DIAGNOSIS — I2510 Atherosclerotic heart disease of native coronary artery without angina pectoris: Secondary | ICD-10-CM

## 2011-04-02 DIAGNOSIS — R7309 Other abnormal glucose: Secondary | ICD-10-CM

## 2011-04-02 NOTE — Patient Instructions (Signed)
If the back pain recurs imaging would be indicated.  Please see the following information concerning the A1c and its monitor.                                              The A1c test checks the average amount of glucose (sugar) in the blood over the last 2 to 3 months.As glucose circulates in the blood, some of it binds to hemoglobin A. This is the main form of hemoglobin in adults. Hemoglobin is a red protein that carries oxygen in the red blood cells (RBC's). Once the glucose is bound to the hemoglobin A, it remains there for the life of the red blood cell (about 120 days). This combination of glucose and hemoglobin A is called A1c (or hemoglobin A1c or glycohemoglobin). Increased glucose in the blood, increases the hemoglobin A1c. A1c levels do not change quickly but will shift as older RBC's die and younger ones take their place.   The A1c test is used primarily to monitor the glucose control of diabetics over time. The goal of those with diabetes is to keep their blood glucose levels as close to normal as possible. This helps to minimize the complications caused by chronically elevated glucose levels, such as progressive damage to body organs like the kidneys, eyes, cardiovascular system, and nerves. The A1c test gives a picture of the average amount of glucose in the blood over the last few months. It can help a patient and his doctor know if the measures they are taking to control the patient's diabetes are successful or need to be adjusted.     Depending on the type of diabetes that you have, how well your diabetes is controlled, your A1c may be measured 2 to 4 times each year. When someone is first diagnosed with diabetes or if control is not good, A1c may be ordered more frequently.   NORMAL VALUES  Non diabetic adults: 5 %-6.1%  Good diabetic control: 6.2-6.4 %  Fair diabetic control: 6.5-7%  Poor diabetic control: greater than 7 % ( except with additional factors such as  advanced age;  significant coronary or neurologic disease,etc). Check the A1c every 6 months if it is < 6.5%; every 4 months if  6.5% or higher. Goals for home glucose monitoring are : fasting  or morning glucose goal of  90-150. Two hours after any meal , goal = < 180, preferably < 150.

## 2011-04-02 NOTE — Progress Notes (Signed)
  Subjective:    Patient ID: Shane Walter, male    DOB: 1946-09-29, 65 y.o.   MRN: [id number]  HPI Fasting Hyperglycemia monitor: his A1c was 6.3 in December/2011. This would correlate with an average sugar of 135 and long-term risk of 26%. The A1c in 6.1% in July/2011. His lipids in December were incredibly good. His LDL was 83 and his HDL 41.  Disease Monitoring  Blood Sugar Ranges: no  Polyuria/ dipsia/phagia: no   Visual problems: no   Medication Compliance: yes, statin & ACE-I  Medication Side Effects  Hypoglycemia: no   Preventitive Health Care  Eye Exam: yes; no retinopathy  Foot Exam: no  Diet pattern: low glycemic carb program  Exercise: walking, stationary bike  or treadmill  45-60 min X 4 / week w/o cardiopulmonary symptoms      Review of Systems after   Fasting glucose status was assessed; he mentioned having upper back pain recently as he was redressing. It has gradually improved since 04/09 . It resolved as of this am. He has had no chest pain.  Location: upper posterior back Quality: dull Onset: 2 days ago Worse with: no definite  trigger or exacerbating factor  but ? related to walking in cold 04/09 Better with: rest & glass of wine ; supine position Radiation: no Trauma: no Best sitting/standing/leaning forward: no  Red Flags Fecal/urinary incontinence: no  Numbness/Weakness: no  Fever/chills/sweats: no  Night pain: no  Unexplained weight loss: no    h/o cancer/immunosuppression: no    PMH of osteoporosis or chronic steroid use: no        Objective:   Physical Exam he appears healthy and well-nourished.  Skin is warm and dry without significant lesions.  Chest is clear to auscultation; he has no rales, rhonchi, or wheezing.  Heart rhythm is regular; he does have a grade 1 systolic murmur.  All pulses are intact. Nail health is good. Sensation to light touch is normal in the feet.  He is focused, motivated, intelligent.  The spine reveals  normal alignment. There is no rash over the upper back.  Has full range of motion of the upper extremities. Strength and tone are normal. The reflexes are also normal.  Has no lymphadenopathy about the neck or axilla.        Assessment & Plan:  #1 fasting hyperglycemia; his A1c is above 6.1 which is typically considered in the diabetic range. The American Diabetes Association now states 6.5% or greater would be diabetes.  Plan: #1 A1c and urine microalbumin will be collected.  The standards  for A1c  monitor will be discussed with him.  Assessment #2: Upper back pain which is now resolved.  Plan: #2 EKG will be performed because of this history coronary disease. If the pain recurs and chest & spine films  would be indicated.

## 2011-04-03 LAB — MICROALBUMIN / CREATININE URINE RATIO: Microalb, Ur: 1.7 mg/dL (ref 0.0–1.9)

## 2011-04-03 LAB — HEMOGLOBIN A1C: Hgb A1c MFr Bld: 6.3 % (ref 4.6–6.5)

## 2011-04-07 ENCOUNTER — Telehealth: Payer: Self-pay | Admitting: Internal Medicine

## 2011-04-07 NOTE — Telephone Encounter (Signed)
Pt would like to have appt scheduled to see Dr. Jearld Adjutant.

## 2011-04-07 NOTE — Telephone Encounter (Signed)
Patient said he saw dr hopper 3 days ago for stomach pain -  Pain went away but came back - he  [place] Spaniel it started yesterday & is getting worse

## 2011-04-08 ENCOUNTER — Encounter: Payer: Self-pay | Admitting: Internal Medicine

## 2011-04-08 ENCOUNTER — Ambulatory Visit (INDEPENDENT_AMBULATORY_CARE_PROVIDER_SITE_OTHER): Payer: Commercial Managed Care - PPO | Admitting: Internal Medicine

## 2011-04-08 VITALS — BP 110/80 | Temp 98.7°F | Wt 202.0 lb

## 2011-04-08 DIAGNOSIS — R109 Unspecified abdominal pain: Secondary | ICD-10-CM

## 2011-04-08 DIAGNOSIS — K219 Gastro-esophageal reflux disease without esophagitis: Secondary | ICD-10-CM

## 2011-04-08 DIAGNOSIS — A048 Other specified bacterial intestinal infections: Secondary | ICD-10-CM

## 2011-04-08 DIAGNOSIS — I2510 Atherosclerotic heart disease of native coronary artery without angina pectoris: Secondary | ICD-10-CM

## 2011-04-08 MED ORDER — ESOMEPRAZOLE MAGNESIUM 40 MG PO CPDR: 40.0000 mg | DELAYED_RELEASE_CAPSULE | Freq: Every day | ORAL | Status: AC

## 2011-04-08 NOTE — Progress Notes (Signed)
  Subjective:    Patient ID: Shane Walter, male    DOB: September 02, 1946, 65 y.o.   MRN: [id number]  HPI ABDOMINAL PAIN  Location: substernal after drinking ice  water  Onset: 05/05/2011  Radiation: mid line then diffuse abdominal cramping  Severity: up to 8 Quality: dull Duration: 60-90 min Better with: eating Worse with: wine Symptoms Nausea/Vomiting:no Diarrhea: no  Constipation: no  Melena/BRBPR: no  Hematemesis: no  Anorexia: yes  Fever/Chills: no  Dysuria: no  Rash: no  Wt loss: no  EtOH use: yes, 1 glass / day  NSAIDs/ASA: no   Past Surgeries: no abdominal surgery.  He has a past medical history of colon polyps; Helicobacter pylori gastritis; and GERD.       Review of Systems     Objective:   Physical Exam Gen.: Healthy and well-nourished in appearance. Alert, appropriate and cooperative throughout exam.  Eyes: No corneal or conjunctival inflammation noted. Pupils equal round reactive to light and accommodation. No icterus Mouth: Oral mucosa and oropharynx reveal no lesions or exudates. Teeth in good repair. No oropharyngeal erytema Neck: No deformities, masses, or tenderness noted. Range of motion normal. Thyroid normal. Lungs: Normal respiratory effort; chest expands symmetrically. Lungs are clear to auscultation without rales, wheezes, or increased work of breathing. Heart: Normal rate and rhythm. Normal S1 and S2. No gallop, click, or rub. Grade 1 / 6 systolic  murmur. Abdomen: Bowel sounds normal; abdomen soft and nontender. No masses, organomegaly or hernias noted.  No clubbing, cyanosis, edema, or deformity noted.  Vascular: Carotid, radial artery, dorsalis pedis and dorsalis posterior tibial pulses are full and equal. No bruits present. Neurologic: Alert and oriented x3.   Skin: Intact without suspicious lesions or rashes. Keloid over sternum. No jaundice. Lymph: No cervical, axillary, or inguinal lymphadenopathy present. Psych: Mood and affect are normal.  Normally interactive                                                                                            Assessment & Plan:   #1 He describes substernal and epigastric pain which is most likely related to esophageal spasm in the context of GERD.  #2 A trial of PPI therapy would be recommended. Triggers for dyspepsia should be avoided.

## 2011-04-08 NOTE — Patient Instructions (Signed)
Take samples of Nexium; fill Rx if symptoms improve.The triggers for dyspepsia or "heart burn"  include stress; the "aspirin family" ; alcohol; peppermint; and caffeine (coffee, tea, cola, and chocolate). The aspirin family would include aspirin and the nonsteroidal agents such as ibuprofen &  Naproxen. Tylenol would not cause reflux. If having dyspepsia ; food & dink should be avoided for @ least 2 hors before going to bed.

## 2011-04-23 ENCOUNTER — Encounter: Payer: Self-pay | Admitting: Internal Medicine

## 2011-04-23 ENCOUNTER — Ambulatory Visit (INDEPENDENT_AMBULATORY_CARE_PROVIDER_SITE_OTHER): Payer: Commercial Managed Care - PPO | Admitting: Internal Medicine

## 2011-04-23 DIAGNOSIS — K625 Hemorrhage of anus and rectum: Secondary | ICD-10-CM

## 2011-04-23 DIAGNOSIS — K649 Unspecified hemorrhoids: Secondary | ICD-10-CM

## 2011-04-23 MED ORDER — HYDROCORTISONE ACE-PRAMOXINE 1-1 % RE FOAM: 1.0000 | Freq: Two times a day (BID) | RECTAL | Status: AC

## 2011-04-23 NOTE — Progress Notes (Signed)
  Subjective:    Patient ID: Shane Walter, male    DOB: Jun 07, 1946, 65 y.o.   MRN: [id number]  HPI HEMORRHOIDAL  PAIN  Onset: 04/29  Severity: up to 6 with BM Quality: previously constant until 05/01   Better with: Preparation  H Worse with: BMs Diarrhea: no  Constipation: yes, chronic but improved in past 10 years  Melena : no;BRBPR: no but blood on tissue  with BMs 4/29 & 30  Fever/Chills: no    Past Surgeries: last colonoscopy @ [facility]  2008; polyps       Review of Systems     Objective:   Physical Exam General appearance is one of good health and nourishment w/o distress.  Chest clear to auscultation;Grade 1 systolic murmur. Abdomen: bowel sounds normal, soft  But slightly tender suprapubic area  without masses, organomegaly or hernias noted.  No guarding or rebound   Skin:Warm & dry.  Intact without suspicious lesions or rashes ; no jaundice or tenting  Lymphatic: No lymphadenopathy is noted about the head, neck, axilla, or inguinal areas.   Rectum reveals excess  Hemorrhoidal tissue. The rectal sphincter tone  is strong; digital exam was uncomfortable. Hemoccult test was negative.           Assessment & Plan:  #1 rectal pain and bleeding with significant hemorrhoidal tissue on exam. Hemoccult testing was negative  Plan: Sitz baths one to 2 times a day followed by ProctoFoam-HC.  If his symptoms fail to resolve; a flexible sigmoidoscopy or anoscopic exam will be necessary.

## 2011-04-23 NOTE — Patient Instructions (Signed)
Used toilet paper to remove the bulk of stool, then use a TUCKS or Baby Wipe.   Used ProctoFoam HC one- 2 times a day after sitz bath.

## 2011-05-05 NOTE — Assessment & Plan Note (Signed)
[facility]                         GASTROENTEROLOGY OFFICE NOTE   PIPER, ALBRO                      MRN:          [id number]  DATE:01/19/2008                            DOB:          1946/05/01    REFERRING PHYSICIAN:  Dr. Alwyn Ren   PROBLEM:  Left lower quadrant pain.   HISTORY:  [name] is a pleasant 65 year old male known to Dr. Marina Goodell  who was last seen in 2003.  At that time it was felt that he had  somewhat chronic left lower quadrant pain but had also been treated for  an episode of diverticulitis.  The patient underwent colonoscopy in  April 2002, which did show left colon diverticulosis, no evidence for  diverticulitis at that time.  He underwent a subsequent CT of the  abdomen and pelvis that did show some wall thickening in the sigmoid  colon consistent with low grade diverticulitis.  There was also some  wall thickening of the bladder suggesting possibility of cystitis.   The patient says that his current symptoms started approximately a week  ago and he was seen by Dr. Alwyn Ren on January 13, 2008.  He has been  placed on a course of Cipro and Flagyl for 7 days for diverticulitis.  He said initially he was nauseated for the first 3-4 days and has had  ongoing abdominal pain but not severe enough to take any pain medicine.  When more specific, he says his pain has never been higher than a 4 or a  5, but it has not improved.  He said yesterday he felt better but today  his pain is somewhat worse.  He is having 2-3 bowel movements per day  which is more frequent than usual for him, though not diarrhea.  He has  not noted any melena or hematochezia.  He has not had any fever or  chills.  He also says he is concerned because at the onset of this  episode he also had a lot of indigestion and reflux of acid.  This  does seem to be improved over the past couple of days.   CURRENT MEDICATIONS:  1. Cipro 500 b.i.d.  2. Flagyl  t.i.d.  3. Aspirin 81 mg daily on hold.  4. Fish oil supplement daily.   ALLERGIES:  No known drug allergies.   PAST HISTORY:  No abdominal surgery and has otherwise been healthy.   FAMILY HISTORY:  Negative for colon cancer or polyps.   SOCIAL HISTORY:  The patient is married.  He is a nonsmoker.  Drinks  alcohol socially.  He is employed as a Education officer, community.   REVIEW OF SYSTEMS:  CARDIOVASCULAR:  Negative.  PULMONARY:  Negative.  GENITOURINARY:  Negative currently.  He says he did have some minimal  pressure with urination the first couple of days of this episode.  GI:  As outlined above.  MUSCULOSKELETAL:  Negative.  NEURO:  Negative.   PHYSICAL EXAMINATION:  GENERAL:  Well developed male in no acute  distress.  VITAL SIGNS:  Height is 5 feet 11 inches, weight is 202, blood pressure  138/68,  pulse is 62.  HEENT:  Nontraumatic, normocephalic.  EOMI.  PERRLA.  Sclerae anicteric.  CARDIOVASCULAR:  Regular rate and rhythm with S1 and S2.  PULMONARY:  Clear to A&P.  ABDOMEN:  Soft.  He is tender in the left lower quadrant.  There is no  palpable mass.  No guarding or rebound.  RECTAL:  Not done today.   IMPRESSION:  13. A 65 year old male with partially treated diverticulitis with      persistent pain.  I doubt complication, i.e., abscess, etcetera but      cannot totally rule out.  I suspect he needs a longer course of      antibiotics to eradicate his symptoms.  2. Gastroesophageal reflux disease and nausea likely aggravated by the      diverticulitis.   PLAN:  1. The patient was given samples of Protonix 40 mg p.o. q.a.m. for      daily use over the next couple of weeks.  2. Schedule CT scan of the abdomen and pelvis with contrast given that      his pain persists after one week of antibiotic therapy.  3. Continue Cipro 500 b.i.d. x7 more days and Flagyl 250 q.i.d. x7      more day.  4. Follow up with Dr. Marina Goodell in approximately 2 weeks.  5. He is due for a followup colonoscopy  but that can be discussed at      followup after his acute diverticulitis has resolved.      Mike Gip, PA-C  Electronically Signed      Iva Boop, MD,FACG  Electronically Signed   AE/MedQ  DD: 01/19/2008  DT: 01/19/2008  Job #: [id number]   cc:   Wilhemina Bonito. Marina Goodell, MD  Titus Dubin. Alwyn Ren, MD,FACP,FCCP

## 2011-05-14 ENCOUNTER — Encounter: Payer: Self-pay | Admitting: Internal Medicine

## 2011-05-19 ENCOUNTER — Telehealth: Payer: Self-pay | Admitting: Internal Medicine

## 2011-05-19 NOTE — Telephone Encounter (Signed)
Pt received letter for colon recall. Pt wants to know if he can have an endo and colon done. Pt states that he has had some stomach discomfort and pain in his left side and would like to have both procedures done. Please advise.

## 2011-05-19 NOTE — Telephone Encounter (Signed)
Yes. Code for EGD is 789.07

## 2011-05-21 ENCOUNTER — Telehealth: Payer: Self-pay | Admitting: Internal Medicine

## 2011-05-21 NOTE — Telephone Encounter (Signed)
Pt scheduled for EGD and Colon per Dr. Marina Goodell 789.07(see prior telephone note).Pt requests appt be scheduled after July 15th when his wife will be back in town. Previsit for 07/06/11@4 :30pm, EGD/Colon scheduled for 07/15/11@9am . Left message for pt to call back regarding appt dates and times.

## 2011-05-25 NOTE — Telephone Encounter (Signed)
Pt aware of appt dates and times. 

## 2011-06-10 ENCOUNTER — Encounter: Payer: Medicare Other | Admitting: Internal Medicine

## 2011-07-06 ENCOUNTER — Ambulatory Visit (AMBULATORY_SURGERY_CENTER): Payer: Commercial Managed Care - PPO | Admitting: *Deleted

## 2011-07-06 VITALS — Ht 71.0 in | Wt 203.2 lb

## 2011-07-06 DIAGNOSIS — R1084 Generalized abdominal pain: Secondary | ICD-10-CM

## 2011-07-06 DIAGNOSIS — Z1211 Encounter for screening for malignant neoplasm of colon: Secondary | ICD-10-CM

## 2011-07-06 MED ORDER — PEG-KCL-NACL-NASULF-NA ASC-C 100 G PO SOLR: ORAL | Status: DC

## 2011-07-15 ENCOUNTER — Encounter: Payer: Self-pay | Admitting: Internal Medicine

## 2011-07-15 ENCOUNTER — Ambulatory Visit (AMBULATORY_SURGERY_CENTER): Payer: Commercial Managed Care - PPO | Admitting: Internal Medicine

## 2011-07-15 DIAGNOSIS — D126 Benign neoplasm of colon, unspecified: Secondary | ICD-10-CM

## 2011-07-15 DIAGNOSIS — K219 Gastro-esophageal reflux disease without esophagitis: Secondary | ICD-10-CM

## 2011-07-15 DIAGNOSIS — R1084 Generalized abdominal pain: Secondary | ICD-10-CM

## 2011-07-15 DIAGNOSIS — R1012 Left upper quadrant pain: Secondary | ICD-10-CM

## 2011-07-15 DIAGNOSIS — Z1211 Encounter for screening for malignant neoplasm of colon: Secondary | ICD-10-CM

## 2011-07-15 DIAGNOSIS — R109 Unspecified abdominal pain: Secondary | ICD-10-CM

## 2011-07-15 MED ORDER — SODIUM CHLORIDE 0.9 % IV SOLN: 500.0000 mL | INTRAVENOUS | Status: DC

## 2011-07-15 NOTE — Patient Instructions (Signed)
Follow discharge instructions.  Continue your medications. If you have problems with heartburn or indigestion , you may use Prilosec OTC 20 mg. Daily on demand.  Await pathology reports.

## 2011-07-16 ENCOUNTER — Telehealth: Payer: Self-pay | Admitting: *Deleted

## 2011-07-16 NOTE — Telephone Encounter (Signed)
Lm on voice mail that identifies pt by name to call if has questions or concerns about procedure yesterday. E McCraw RN

## 2011-07-21 ENCOUNTER — Encounter: Payer: Self-pay | Admitting: Internal Medicine

## 2011-07-27 ENCOUNTER — Telehealth: Payer: Self-pay | Admitting: Internal Medicine

## 2011-07-27 NOTE — Telephone Encounter (Signed)
Pt had questions about the results letter from his colonoscopy. Reviewed letter with pt and answered all of his questions. Pt was confused due to 5 year recall vs 10 year. Explained to pt it was because the polyp was adenomatous.

## 2011-09-01 ENCOUNTER — Telehealth: Payer: Self-pay

## 2011-09-01 NOTE — Telephone Encounter (Signed)
Pt states that he has things to discuss with Alwyn Ren and is requesting an OV for today

## 2011-09-01 NOTE — Telephone Encounter (Signed)
Vision problem since yesterday. Appointment scheduled

## 2011-09-02 ENCOUNTER — Ambulatory Visit (INDEPENDENT_AMBULATORY_CARE_PROVIDER_SITE_OTHER): Payer: Medicare Other | Admitting: Internal Medicine

## 2011-09-02 ENCOUNTER — Other Ambulatory Visit: Payer: Self-pay | Admitting: *Deleted

## 2011-09-02 ENCOUNTER — Encounter: Payer: Self-pay | Admitting: Internal Medicine

## 2011-09-02 DIAGNOSIS — E785 Hyperlipidemia, unspecified: Secondary | ICD-10-CM

## 2011-09-02 DIAGNOSIS — G453 Amaurosis fugax: Secondary | ICD-10-CM

## 2011-09-02 DIAGNOSIS — I2510 Atherosclerotic heart disease of native coronary artery without angina pectoris: Secondary | ICD-10-CM

## 2011-09-02 DIAGNOSIS — I10 Essential (primary) hypertension: Secondary | ICD-10-CM

## 2011-09-02 DIAGNOSIS — R7309 Other abnormal glucose: Secondary | ICD-10-CM

## 2011-09-02 DIAGNOSIS — H3400 Transient retinal artery occlusion, unspecified eye: Secondary | ICD-10-CM

## 2011-09-02 NOTE — Progress Notes (Signed)
  Subjective:    Patient ID: Shane Walter, male    DOB: [birth year], 65 y.o.   MRN: [id number]  HPI  Dr. Deirdre Evener evaluation  09/01/2011 was reviewed (report scanned). She questions amaurosis of the left eye possibly related to a vascular etiology. He does have an appointment to see Dr. Luciana Axe, retinal specialist 9/13.  He had laser surgery of the right eye in 1995. This has resulted in  distorted vision in OD  He has had some neck and shoulder pain in the week last week.  He has a past history of lumbosacral degenerative disc disease but not cervical disease.     Review of Systems NECK PAIN: Location: base of skull   Severity: up to 5 Pain is described as: sharp  Worse with: lateral neck rotation in either direction    Better with: massage   Pain radiates to: no   Impaired range of motion: no History of repetitive motion:  no  History of overuse or hyperextension:  no  History of trauma:  no   Past history of similar problem:  yes, but much less severe   Symptoms Back Pain:  yes, & in shoulders  Numbness/tingling:  no  Weakness:  no  Red Flags Fever:  no  Headache:  no  Bowel/bladder dysfunction:  no  His blood pressure has been well-controlled at home       Objective:   Physical Exam  Gen. appearance: Well-nourished, in no distress Eyes: Extraocular motion intact, field of vision normal, no nystagmus.OD vision decreased ENT: Canals clear, tympanic membranes normal, tuning fork exam normal, hearing grossly normal Neck: Normal range of motion, no masses, normal thyroid Cardiovascular: Rate and rhythm normal; no gallops or extra heart sounds. Grade 1/6 systolic murmur Muscle skeletal:  tone, &  strength normal. No spinal curvature noted. Neuro:no cranial nerve deficit, deep tendon  reflexes normal, gait normal, finger-nose and Romberg testing normal. Lymph: No cervical or axillary LA Skin: Warm and dry without suspicious lesions or rashes. There is no tenderness to  palpation over the temples. Psych: no anxiety or mood change. Normally interactive and cooperative.         Assessment & Plan:  #1 amaurosis left eye  #2 recent neck and shoulder pain  #3 hypertension controlled  #4 coronary artery disease  #5 diabetes  Plan: Fasting labs will be checked to include sed rate.  Carotid Doppler will be performed along with 2-D echocardiogram.

## 2011-09-02 NOTE — Patient Instructions (Signed)
Please  schedule fasting Labs in am : BMET,Lipids, hepatic panel, CBC & dif, TSH, A1c, sed rate (see Diagnoses for Codes)

## 2011-09-03 ENCOUNTER — Other Ambulatory Visit (INDEPENDENT_AMBULATORY_CARE_PROVIDER_SITE_OTHER): Payer: Medicare Other

## 2011-09-03 DIAGNOSIS — R7309 Other abnormal glucose: Secondary | ICD-10-CM

## 2011-09-03 DIAGNOSIS — E785 Hyperlipidemia, unspecified: Secondary | ICD-10-CM

## 2011-09-03 DIAGNOSIS — I10 Essential (primary) hypertension: Secondary | ICD-10-CM

## 2011-09-03 LAB — CBC WITH DIFFERENTIAL/PLATELET
Basophils Absolute: 0 10*3/uL (ref 0.0–0.1)
Eosinophils Absolute: 0.3 10*3/uL (ref 0.0–0.7)
Eosinophils Relative: 4.1 % (ref 0.0–5.0)
HCT: 41.9 % (ref 39.0–52.0)
Lymphs Abs: 2.4 10*3/uL (ref 0.7–4.0)
MCHC: 32.8 g/dL (ref 30.0–36.0)
MCV: 88.1 fl (ref 78.0–100.0)
Monocytes Absolute: 0.6 10*3/uL (ref 0.1–1.0)
Platelets: 193 10*3/uL (ref 150.0–400.0)
RDW: 13.5 % (ref 11.5–14.6)

## 2011-09-03 LAB — LIPID PANEL
Cholesterol: 158 mg/dL (ref 0–200)
LDL Cholesterol: 89 mg/dL (ref 0–99)
Triglycerides: 122 mg/dL (ref 0.0–149.0)
VLDL: 24.4 mg/dL (ref 0.0–40.0)

## 2011-09-03 LAB — HEPATIC FUNCTION PANEL
ALT: 19 U/L (ref 0–53)
AST: 24 U/L (ref 0–37)
Alkaline Phosphatase: 40 U/L (ref 39–117)
Bilirubin, Direct: 0 mg/dL (ref 0.0–0.3)
Total Bilirubin: 0.6 mg/dL (ref 0.3–1.2)

## 2011-09-03 LAB — BASIC METABOLIC PANEL
Chloride: 108 mEq/L (ref 96–112)
Potassium: 4.9 mEq/L (ref 3.5–5.1)

## 2011-09-03 LAB — TSH: TSH: 0.36 u[IU]/mL (ref 0.35–5.50)

## 2011-09-03 NOTE — Progress Notes (Signed)
Labs only

## 2011-09-29 ENCOUNTER — Encounter (INDEPENDENT_AMBULATORY_CARE_PROVIDER_SITE_OTHER): Payer: Commercial Managed Care - PPO | Admitting: *Deleted

## 2011-09-29 ENCOUNTER — Ambulatory Visit (HOSPITAL_COMMUNITY): Payer: Commercial Managed Care - PPO | Attending: Internal Medicine | Admitting: Radiology

## 2011-09-29 DIAGNOSIS — R011 Cardiac murmur, unspecified: Secondary | ICD-10-CM

## 2011-09-29 DIAGNOSIS — G453 Amaurosis fugax: Secondary | ICD-10-CM

## 2011-09-29 DIAGNOSIS — R55 Syncope and collapse: Secondary | ICD-10-CM

## 2011-09-29 DIAGNOSIS — G459 Transient cerebral ischemic attack, unspecified: Secondary | ICD-10-CM

## 2011-09-29 DIAGNOSIS — H53129 Transient visual loss, unspecified eye: Secondary | ICD-10-CM

## 2011-09-30 ENCOUNTER — Telehealth: Payer: Self-pay | Admitting: Internal Medicine

## 2011-09-30 NOTE — Telephone Encounter (Signed)
Dr.Hopper please advise on report

## 2011-10-01 ENCOUNTER — Telehealth: Payer: Self-pay

## 2011-10-01 NOTE — Telephone Encounter (Signed)
Message copied by Edgardo Roys on Thu Oct 01, 2011  9:08 AM ------      Message from: Pecola Lawless      Created: Thu Oct 01, 2011  8:58 AM       The 2 D ECHO revealed that the  left ventricle or  main pumping chamber has good function. There is mild back flow across the mitral valve, the valve between the upper and lower chambers normal left. This backflow appears to be causing mild enlargement of the upper chamber or atrium. There is a questionable issue of possible impaired filling of the left ventricle.These  results should be reviewed with your Cardiologist at [facility] to define their clinical significance.[employer]

## 2011-10-01 NOTE — Telephone Encounter (Signed)
I called patient, Dr.Hopper was standing in front of me and spoke with patient-gave all information. Patient was instructed to contact cardiologist for f/u appointment. Report to be faxed to patient at [phone]) and to cardiologist: Andria Frames

## 2011-10-05 ENCOUNTER — Other Ambulatory Visit: Payer: Self-pay | Admitting: Internal Medicine

## 2011-10-05 MED ORDER — METOPROLOL SUCCINATE ER 25 MG PO TB24: 25.0000 mg | ORAL_TABLET | Freq: Two times a day (BID) | ORAL | Status: DC

## 2011-10-05 NOTE — Telephone Encounter (Signed)
RX sent

## 2011-12-28 ENCOUNTER — Telehealth: Payer: Self-pay

## 2011-12-28 NOTE — Telephone Encounter (Signed)
With his cardiac history this is an appropriate response.

## 2011-12-28 NOTE — Telephone Encounter (Signed)
Pt's wife called and states pt is experiencing severe pain on left side under ribs, Pt did not sleep well last night and would like ov.     Called pt to get more information.  Pt states the left side of his stomach has been very painful x 4 days.  Pt advised to go to the ER for evaluation.

## 2012-01-08 ENCOUNTER — Other Ambulatory Visit: Payer: Self-pay | Admitting: Internal Medicine

## 2012-01-08 MED ORDER — ROSUVASTATIN CALCIUM 20 MG PO TABS: 20.0000 mg | ORAL_TABLET | Freq: Every day | ORAL | Status: DC

## 2012-01-08 MED ORDER — LISINOPRIL 10 MG PO TABS: 10.0000 mg | ORAL_TABLET | Freq: Every day | ORAL | Status: DC

## 2012-01-08 NOTE — Telephone Encounter (Signed)
RX sent

## 2012-01-13 ENCOUNTER — Encounter: Payer: Self-pay | Admitting: Internal Medicine

## 2012-01-13 ENCOUNTER — Ambulatory Visit (INDEPENDENT_AMBULATORY_CARE_PROVIDER_SITE_OTHER): Payer: Commercial Managed Care - PPO | Admitting: Internal Medicine

## 2012-01-13 DIAGNOSIS — I10 Essential (primary) hypertension: Secondary | ICD-10-CM

## 2012-01-13 DIAGNOSIS — R195 Other fecal abnormalities: Secondary | ICD-10-CM

## 2012-01-13 DIAGNOSIS — R05 Cough: Secondary | ICD-10-CM

## 2012-01-13 DIAGNOSIS — R1012 Left upper quadrant pain: Secondary | ICD-10-CM

## 2012-01-13 LAB — CBC WITH DIFFERENTIAL/PLATELET
Eosinophils Absolute: 0.4 10*3/uL (ref 0.0–0.7)
Eosinophils Relative: 5.3 % — ABNORMAL HIGH (ref 0.0–5.0)
Lymphocytes Relative: 25.3 % (ref 12.0–46.0)
MCHC: 33.8 g/dL (ref 30.0–36.0)
MCV: 86.8 fl (ref 78.0–100.0)
Monocytes Absolute: 0.8 10*3/uL (ref 0.1–1.0)
Neutrophils Relative %: 57.1 % (ref 43.0–77.0)
Platelets: 199 10*3/uL (ref 150.0–400.0)
RBC: 4.92 Mil/uL (ref 4.22–5.81)
WBC: 7.3 10*3/uL (ref 4.5–10.5)

## 2012-01-13 MED ORDER — OMEPRAZOLE 20 MG PO CPDR: 20.0000 mg | DELAYED_RELEASE_CAPSULE | Freq: Every day | ORAL | Status: DC

## 2012-01-13 MED ORDER — MONTELUKAST SODIUM 10 MG PO TABS: 10.0000 mg | ORAL_TABLET | Freq: Every day | ORAL | Status: DC

## 2012-01-13 MED ORDER — LOSARTAN POTASSIUM 100 MG PO TABS: 100.0000 mg | ORAL_TABLET | Freq: Every day | ORAL | Status: DC

## 2012-01-13 NOTE — Patient Instructions (Addendum)
The triggers for reflux  include stress; the "aspirin family" ; alcohol; peppermint; and caffeine (coffee, tea, cola, and chocolate). The aspirin family would include aspirin and the nonsteroidal agents such as ibuprofen &  Naproxen. Tylenol would not cause reflux. If having reflux ; food & drink should be avoided for @ least 2 hours before going to bed.  Please take the Prilosec OTC 1 by mouth 30 minutes before breakfast and one 30 minutes for the evening meal.

## 2012-01-13 NOTE — Progress Notes (Signed)
Addended byPecola Lawless on: 01/13/2012 08:58 AM   Modules accepted: Orders

## 2012-01-13 NOTE — Progress Notes (Signed)
  Subjective:    Patient ID: Shane Walter, male    DOB: September 27, 1946, 66 y.o.   MRN: [id number]  HPI #1  COUGH: Onset: 2-3 weeks ago as dry cough paroxysmally Trigger:no Course: stable Treatment/efficacy: Advil for head congestion present 2 days Associated symptoms/signs:  URI symptoms: No facial pain, frontal headaches, nasal purulence, dental pain,sorethroat, or ear ache/otic discharge. He has had rhinitis x 2 days with clear "water" Extrinsic symptoms : itchy eyes, sneezing,but no angioedema Infectious symptoms: No fever, chills, sweats, and purulent secretions Chest symptoms: No pleuritic pain, sputum production, hemoptysis, dyspnea, or wheezing GI symptoms: No dyspepsia, dysphagia, reflux symptoms Occupational/environmental exposures:no Smoking history:quit 1977 ACE inhibitor administration: Lisinopril 10 mg daily Past medical history/family history pulmonary disease: no  #2 ABDOMINAL PAIN: Location: L lateral abdomen  Onset: for years  Radiation: to L axilla only in past 1 week  Severity: up to 5 Quality: dull  Duration: minutes Better with:soup Worse with: no factors Symptoms Nausea/Vomiting: nausea only Diarrhea: no,   Constipation: no  Melena/BRBPR: dark stool 2 weeks ago X 1  Hematemesis: no  Anorexia: no  Fever/Chills: no  Dysuria/ hematuria/ pyuria: no  Rash: no  Wt loss: no  EtOH use: no  NSAIDs/ASA: yes, 4 Advil over 2 days    Past Surgeries: see PMH; last Endo by Dr Marina Goodell ; negative           Review of Systems     Objective:   Physical Exam General appearance:good health ;well nourished; no acute distress or increased work of breathing is present.  No  lymphadenopathy about the head, neck, or axilla noted.   Eyes: No conjunctival inflammation or lid edema is present. There is no scleral icterus.  Ears:  External ear exam shows no significant lesions or deformities.  Otoscopic examination reveals essentially clear canals ( small collections of  wax), tympanic membranes are intact bilaterally without bulging, retraction, inflammation or discharge.  Nose:  External nasal examination shows no deformity or inflammation. Nasal mucosa are pink and moist without lesions or exudates. No septal dislocation or deviation.No obstruction to airflow.   Oral exam: Dental hygiene is good; lips and gums are healthy appearing.There is minimal oropharyngeal erythema w/o exudate.    Heart: Slow rate and regular rhythm. S1 and S2 normal without gallop,  click, rub or other extra sounds. Grade 1/2 over 6 systolic murmur @ base  Lungs:Chest clear to auscultation; no wheezes, rhonchi,rales ,or rubs present.No increased work of breathing.   Abdomen: No organomegaly or masses present. Minimal tenderness in the left lateral abdomen. Slight ventral hernia noted when sitting up   Extremities:  No cyanosis, edema, or clubbing  noted . Homans negative   Skin: Warm & dry w/o jaundice           Assessment & Plan:   #1 cough; the most likely etiology is ACE inhibitor lisinopril. He does have mild extrinsic symptomatology as well. Reflux may be also contributing to the cough ( see #2)  #2 abdominal pain, chronic/recurrent. This is in the context of prior H. pylori gastritis. He has had some nonsteroidal ingestion recently. The radiation superiorly suggesting possible component of hiatal hernia in addition to reflux  Plan: See orders and recommendations

## 2012-01-26 ENCOUNTER — Ambulatory Visit (INDEPENDENT_AMBULATORY_CARE_PROVIDER_SITE_OTHER): Payer: Commercial Managed Care - PPO | Admitting: Internal Medicine

## 2012-01-26 ENCOUNTER — Encounter: Payer: Self-pay | Admitting: Internal Medicine

## 2012-01-26 ENCOUNTER — Other Ambulatory Visit (INDEPENDENT_AMBULATORY_CARE_PROVIDER_SITE_OTHER): Payer: Commercial Managed Care - PPO

## 2012-01-26 DIAGNOSIS — J209 Acute bronchitis, unspecified: Secondary | ICD-10-CM

## 2012-01-26 DIAGNOSIS — Z1282 Encounter for screening for malignant neoplasm of nervous system: Secondary | ICD-10-CM

## 2012-01-26 DIAGNOSIS — K219 Gastro-esophageal reflux disease without esophagitis: Secondary | ICD-10-CM

## 2012-01-26 DIAGNOSIS — I2510 Atherosclerotic heart disease of native coronary artery without angina pectoris: Secondary | ICD-10-CM

## 2012-01-26 LAB — HEMOCCULT GUIAC POC 1CARD (OFFICE): Fecal Occult Blood, POC: NEGATIVE

## 2012-01-26 MED ORDER — AZITHROMYCIN 250 MG PO TABS: ORAL_TABLET | ORAL | Status: AC

## 2012-01-26 MED ORDER — OMEPRAZOLE 20 MG PO CPDR: 20.0000 mg | DELAYED_RELEASE_CAPSULE | Freq: Every day | ORAL | Status: DC

## 2012-01-26 NOTE — Patient Instructions (Signed)
Plain Mucinex for thick secretions ;force NON dairy fluids. Use a Neti pot daily as needed for sinus congestion  

## 2012-01-26 NOTE — Progress Notes (Signed)
  Subjective:    Patient ID: Shane Walter, male    DOB: Jan 19, 1946, 66 y.o.   MRN: [id number]  HPI  COUGH  Onset:present @ last OV but dry  Trigger:preceded by RTI Course:productive as of yesterday Treatment/efficacy:none Associated symptoms/signs:  URI symptoms: No facial pain, frontal headaches, nasal purulence ( all clear), dental pain, or ear ache/otic discharge. ST from cough Extrinsic symptoms: No itchy eyes or angioedema. Sneezing intermittently Infectious symptoms: No fever, chills, or  sweats.Sputum  purulent (green) with some red after harsh cough Chest symptoms: No pleuritic pain,  dyspnea, or wheezing.  GI symptoms: No dyspepsia, dysphagia, reflux symptoms Occupational/environmental exposures:no Smoking history:quit 1977 ACE inhibitor administration:no, on ARB Past medical history/family history pulmonary disease: Mother had COPD, O2 dependent    Review of Systems     Objective:   Physical Exam General appearance:good health ;well nourished; no acute distress or increased work of breathing is present.  No  lymphadenopathy about the head, neck, or axilla noted.   Eyes: No conjunctival inflammation or lid edema is present.   Ears:  External ear exam shows no significant lesions or deformities.  Otoscopic examination reveals some wax  bilaterally without bulging, retraction, inflammation or discharge.  Nose:  External nasal examination shows no deformity or inflammation. Nasal mucosa are pink and moist without lesions or exudates. No septal dislocation or deviation.No obstruction to airflow.   Oral exam: Dental hygiene is good; lips and gums are healthy appearing.There is no oropharyngeal erythema or exudate noted.    Heart:  Normal rate and regular rhythm. S1 and S2 normal without gallop, murmur, click, rub or other extra sounds.   Lungs:Chest clear to auscultation; no wheezes, rhonchi,rales ,or rubs present.No increased work of breathing.    Extremities:  No  cyanosis, edema, or clubbing  noted    Skin: Warm & dry          Assessment & Plan:    #1 bronchitis, acute purulent secretions  Plan: See orders and recommendations

## 2012-01-27 ENCOUNTER — Encounter: Payer: Self-pay | Admitting: Internal Medicine

## 2012-01-27 ENCOUNTER — Ambulatory Visit (INDEPENDENT_AMBULATORY_CARE_PROVIDER_SITE_OTHER): Payer: Commercial Managed Care - PPO | Admitting: Internal Medicine

## 2012-01-27 ENCOUNTER — Telehealth: Payer: Self-pay | Admitting: *Deleted

## 2012-01-27 VITALS — BP 130/72 | HR 61 | Temp 98.9°F | Wt 201.0 lb

## 2012-01-27 DIAGNOSIS — H109 Unspecified conjunctivitis: Secondary | ICD-10-CM

## 2012-01-27 DIAGNOSIS — I25810 Atherosclerosis of coronary artery bypass graft(s) without angina pectoris: Secondary | ICD-10-CM

## 2012-01-27 MED ORDER — ERYTHROMYCIN 5 MG/GM OP OINT: TOPICAL_OINTMENT | OPHTHALMIC | Status: DC

## 2012-01-27 NOTE — Patient Instructions (Signed)
To ER if pain ,pus, fever, or vision loss occur,the Warning Signs as discussed. Use the ophthalmologic ointment as directed. Strict hand washing before and after its application.

## 2012-01-27 NOTE — Telephone Encounter (Signed)
Pt scheduled to come in at 2pm today

## 2012-01-27 NOTE — Telephone Encounter (Signed)
[facility] Triage Call Report Triage Record Num: [id number] Operator: Freddie Breech Patient Name: Shane Walter Call Date & Time: 01/27/2012 8:51:28AM Patient Phone: [phone] PCP: Marga Melnick Patient Gender: Male PCP Fax : [phone] Patient DOB: [birth year] Practice Name: [facility] Reason for Call: Caller: Roxana/Spouse; PCP: Marga Melnick; CB#: [phone]; Call regarding swollen eyes with yellow drainage. On a Zpack for bronchitis since 01/26/12. Eye drng began after beginning the abx. Appt sched for 1400 today with Dr. Alwyn Ren per Eye: Inf Guideline. Protocol(s) Used: Eye: Infection or Irritation Recommended Outcome per Protocol: See Provider within 4 hours Reason for Outcome: New onset of severe sensitivity to light or glare (photophobia), eye pain, reduced vision, tearing, or discharge.

## 2012-01-27 NOTE — Progress Notes (Signed)
  Subjective:    Patient ID: Shane Walter, male    DOB: [birth year], 66 y.o.   MRN: [id number]  HPI After being seen 2/5 he noted acute blurring of his vision. Subsequently he noted yellowish discharge from the eyes. He gavaged  his eyes with tap water; subsequently noted some dry powdery-type material on lashes upon awakening. At this time he denies any visual disturbance such as diplopia or loss of vision; but he has marked photosensitivity. He denies fever, chills, or sweats. He continues to notice yellow discharge denies.    Review of Systems     Objective:   Physical Exam General appearance:good health ;well nourished; no acute distress or increased work of breathing is present.  No  lymphadenopathy about the head, neck, or axilla noted.   Eyes: Mild OS > OD  conjunctival inflammation with mild lid edema is present. There is dry exfoliative material of the left lids. No frank purulence is visualized. There is arcus senilis. Mild scleritis is suggested. Extraocular motion is intact. Wall chart is read with lenses  Ears:  External ear exam shows no significant lesions or deformities.  Otoscopic examination reveals clear canals, tympanic membranes are intact bilaterally without bulging, retraction, inflammation or discharge.  Nose:  External nasal examination shows no deformity or inflammation. Nasal mucosa are pink and moist without lesions or exudates. No septal dislocation or deviation.No obstruction to airflow.   Oral exam: Dental hygiene is good; lips and gums are healthy appearing.There is no oropharyngeal erythema or exudate noted.   Neck:   Supple with full range of motion without pain.   Skin: Warm & dry           Assessment & Plan:  #1 conjunctivitis, bacterial  Plan: See orders and recommendations

## 2012-02-23 ENCOUNTER — Ambulatory Visit: Payer: Commercial Managed Care - PPO | Admitting: Cardiology

## 2012-03-03 ENCOUNTER — Encounter: Payer: Self-pay | Admitting: Cardiology

## 2012-03-03 ENCOUNTER — Ambulatory Visit (INDEPENDENT_AMBULATORY_CARE_PROVIDER_SITE_OTHER): Payer: Commercial Managed Care - PPO | Admitting: Cardiology

## 2012-03-03 VITALS — BP 144/72 | HR 54 | Ht 71.0 in | Wt 202.8 lb

## 2012-03-03 DIAGNOSIS — I2510 Atherosclerotic heart disease of native coronary artery without angina pectoris: Secondary | ICD-10-CM

## 2012-03-03 DIAGNOSIS — E785 Hyperlipidemia, unspecified: Secondary | ICD-10-CM

## 2012-03-03 DIAGNOSIS — R931 Abnormal findings on diagnostic imaging of heart and coronary circulation: Secondary | ICD-10-CM

## 2012-03-03 DIAGNOSIS — I25810 Atherosclerosis of coronary artery bypass graft(s) without angina pectoris: Secondary | ICD-10-CM

## 2012-03-03 DIAGNOSIS — I10 Essential (primary) hypertension: Secondary | ICD-10-CM

## 2012-03-03 DIAGNOSIS — R9389 Abnormal findings on diagnostic imaging of other specified body structures: Secondary | ICD-10-CM

## 2012-03-03 NOTE — Patient Instructions (Addendum)
Your physician recommends that you have  a FASTING lipid profile /liver profile--you have the order.  Your physician wants you to follow-up in: 1 year with Dr Shirlee Latch. (March 2014). You will receive a reminder letter in the mail two months in advance. If you don't receive a letter, please call our office to schedule the follow-up appointment.

## 2012-03-04 LAB — HEPATIC FUNCTION PANEL
ALT: 24 U/L (ref 0–53)
Albumin: 4 g/dL (ref 3.5–5.2)
Bilirubin, Direct: 0.1 mg/dL (ref 0.0–0.3)
Total Protein: 7.7 g/dL (ref 6.0–8.3)

## 2012-03-04 LAB — LIPID PANEL
HDL: 53.4 mg/dL (ref >39.00)
LDL Cholesterol: 86 mg/dL (ref 0–99)
Total CHOL/HDL Ratio: 3
VLDL: 19.2 mg/dL (ref 0.0–40.0)

## 2012-03-04 NOTE — Progress Notes (Signed)
Pt came in on the 03/04/2012 with a order from Doctor Marca Ancona that he needed a lipid and liver panel. Please send results to his office.

## 2012-03-04 NOTE — Progress Notes (Signed)
Addended by: Silvio Pate D on: 03/04/2012 03:03 PM   Modules accepted: Orders

## 2012-03-05 DIAGNOSIS — R931 Abnormal findings on diagnostic imaging of heart and coronary circulation: Secondary | ICD-10-CM | POA: Insufficient documentation

## 2012-03-05 NOTE — Assessment & Plan Note (Signed)
Check lipids/LFTs, goal LDL < 70.  

## 2012-03-05 NOTE — Progress Notes (Signed)
PCP: Dr. Alwyn Ren  66 yo with history of HTN, DM, and CAD s/p CABG presents to establish cardiology followup.  Patient had an episode of unstable angina in 5/09 and had CABG at [facility].  He saw a cardiologist at [facility] until that doctor retired.  He has been doing well symptomatically.  He walks on a treadmilll or outdoors 4-5 days/week.  No exertional chest pain or dyspnea.  BP is mildly elevated in the office today but has been normal when he checks it at home.  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  He is originally from [place].    ECG: NSR, normal  Labs (9/12): K 4.9, creatinine 1.1, LDL 89, HDL 45  PMH: 1. H. Pylori gastritis 2. L-spine degenerative disease.  3. HTN 4. DM  5. Question of prior amaurosis fugax 6. CAD: 3-vessel CABG in 5/09 at [facility] after episode of unstable angina.  Echo (10/12) with EF 55-60%, restrictive diastolic function, mild MR. 7. Carotid (10/12) with no significant plaque.   FH: 1/2 brother with CABG  SH: Quit smoking in 1977, manages [employer], married, originally from [place], 3 children.   ROS: All systems reviewed and negative except as per HPI.   Current Outpatient Prescriptions  Medication Sig Dispense Refill  . aspirin 81 MG tablet Take 81 mg by mouth daily.        Marland Kitchen losartan (COZAAR) 100 MG tablet Take 1 tablet (100 mg total) by mouth daily.  30 tablet  2  . metoprolol succinate (TOPROL-XL) 25 MG 24 hr tablet Take 1 tablet (25 mg total) by mouth 2 (two) times daily.  180 tablet  1  . Omega-3 Fatty Acids (FISH OIL) 1000 MG CAPS Take 1,000 mg by mouth daily.        Marland Kitchen omeprazole (PRILOSEC) 20 MG capsule Take 20 mg by mouth as needed.      . rosuvastatin (CRESTOR) 20 MG tablet Take 1 tablet (20 mg total) by mouth daily.  30 tablet  8   Current Facility-Administered Medications  Medication Dose Route Frequency Provider Last Rate Last Dose  . 0.9 %  sodium chloride infusion  500 mL Intravenous Continuous  Hilarie Fredrickson, MD        BP 144/72  Pulse 54  Ht 5\' 11"  (1.803 m)  Wt 202 lb 12.8 oz (91.989 kg)  BMI 28.28 kg/m2 General: NAD Neck: No JVD, no thyromegaly or thyroid nodule.  Lungs: Clear to auscultation bilaterally with normal respiratory effort. CV: Nondisplaced PMI.  Heart regular S1/S2, no S3/S4, no murmur.  No peripheral edema.  No carotid bruit.  Normal pedal pulses.  Abdomen: Soft, nontender, no hepatosplenomegaly, no distention.  Skin: Intact without lesions or rashes.  Neurologic: Alert and oriented x 3.  Psych: Normal affect. Extremities: No clubbing or cyanosis.  HEENT: Normal.

## 2012-03-05 NOTE — Assessment & Plan Note (Signed)
Stable s/p CABG with no ischemic symptoms.  Continue ASA 81, Toprol XL, losartan, Crestor.

## 2012-03-05 NOTE — Assessment & Plan Note (Signed)
Normal LV systolic function but reported restrictive diastolic function on last echo.  If true, this may be the sequelae of prior ischemia as well as HTN.  As he denies exertional dyspnea, I will make no changes to his medications but will need to make sure BP is controlled over time.

## 2012-03-05 NOTE — Assessment & Plan Note (Signed)
BP slightly elevated today but has been well-controlled at home.  No change to regimen.

## 2012-03-29 ENCOUNTER — Other Ambulatory Visit: Payer: Self-pay | Admitting: Internal Medicine

## 2012-03-29 NOTE — Telephone Encounter (Signed)
Refill done.  

## 2012-04-15 ENCOUNTER — Other Ambulatory Visit: Payer: Self-pay | Admitting: Internal Medicine

## 2012-08-23 ENCOUNTER — Telehealth: Payer: Self-pay | Admitting: Internal Medicine

## 2012-08-23 NOTE — Telephone Encounter (Signed)
I called patient and offered appointment with Shane Walter, patient's wife stated patient only to see Dr.Hopper. I requested to speak directly with patient, I was given patient's work number [phone].  I called and spoke with patient, I offered patient appointment to see Melissa at 11:30 (if he would be able to leave work now) or afternoon appointment with Shane Walter in [employer] office (10 min from our office) , patient refused and stated he only would like to see Dr.Hopper. I offered patient appointment with Dr.Hopper for Wed, patient refused and stated he has several other commitments for Wed (other appointment's), patient requested appointment for Thursday. Schedule appointment for Thursday at 4:00pm.  Patient was instructed to go to the emergency room or urgent care if needed prior to his appointment with Dr.Hopper, patient agreed.

## 2012-08-23 NOTE — Telephone Encounter (Signed)
Patient calling, asking for an appt. with Dr. Alwyn Ren.  Having intensified left side abdominal pain.  Has had the pain intermittently for several years and has had to have different antibiotics for same.  Also has the pain in left testicular area.   Needs to be seen immediately.  No appointments available with MD or Dr. Drue Novel or any physician.  Please call to schedule.

## 2012-08-25 ENCOUNTER — Ambulatory Visit (INDEPENDENT_AMBULATORY_CARE_PROVIDER_SITE_OTHER): Payer: Commercial Managed Care - PPO | Admitting: Internal Medicine

## 2012-08-25 ENCOUNTER — Encounter: Payer: Self-pay | Admitting: Internal Medicine

## 2012-08-25 VITALS — BP 114/70 | HR 65 | Temp 98.4°F | Wt 204.6 lb

## 2012-08-25 DIAGNOSIS — N509 Disorder of male genital organs, unspecified: Secondary | ICD-10-CM

## 2012-08-25 DIAGNOSIS — R1032 Left lower quadrant pain: Secondary | ICD-10-CM

## 2012-08-25 DIAGNOSIS — R102 Pelvic and perineal pain: Secondary | ICD-10-CM

## 2012-08-25 DIAGNOSIS — R319 Hematuria, unspecified: Secondary | ICD-10-CM

## 2012-08-25 MED ORDER — CIPROFLOXACIN HCL 500 MG PO TABS: 500.0000 mg | ORAL_TABLET | Freq: Two times a day (BID) | ORAL | Status: AC

## 2012-08-25 MED ORDER — METRONIDAZOLE 500 MG PO TABS: 500.0000 mg | ORAL_TABLET | Freq: Three times a day (TID) | ORAL | Status: AC

## 2012-08-25 NOTE — Progress Notes (Signed)
  Subjective:    Patient ID: Shane Walter, male    DOB: 1946/03/12, 66 y.o.   MRN: [id number]  HPI He presents with 2 separate pain syndromes.  The first has actually been present for years but was  exacerbated last week. It is described as a left lateral abdominal dull pain which will radiate superiorly or inferiorly in the same area. He feels this may have been aggravated by the increased  intake of alcohol last week during a social occasion. He denies associated fever, chills, weight loss, melena, rectal bleeding, constipation, or diarrhea. He has had some nocturnal sweats.  Also last week he began to have dull discomfort in the left testicle. He denies dysuria, pyuria, or hematuria. He questioned possible relationship to the prostate  Past medical history/family history/social history were all reviewed and updated. Pertinent data: He has had colon polyps as well as diverticulosis. His 1/2 sister has been diagnosed with breast cancer with lymph node involvement.    Review of Systems Nausea/Vomiting: no Hematemesis: no Anorexia: no Rash: no NSAIDs/ASA: no other than low dose ASA        Objective:   Physical Exam General appearance is one of good health and nourishment w/o distress.  Eyes: No conjunctival inflammation or scleral icterus is present.  Oral exam: Dental hygiene is good; lips and gums are healthy appearing.There is no oropharyngeal erythema or exudate noted.   Heart:  Normal rate and regular rhythm. S1 and S2 normal without gallop,  click, rub or other extra sounds  .Grade 1/2-1over 6 systolic murmur    Lungs:Chest clear to auscultation; no wheezes, rhonchi,rales ,or rubs present.No increased work of breathing.   Abdomen: bowel sounds normal, soft but slightly tender left lower quadrant without masses, organomegaly or hernias noted.  No guarding or rebound   Skin:Warm & dry.  Intact without suspicious lesions or rashes ; no jaundice or tenting  Lymphatic: No  lymphadenopathy is noted about the head, neck, axilla, or inguinal areas.   Genitourinary:Genitalia normal except for left varices ; clinically no evidence of hernia or epididymitis  Vascular: All pulses intact without bruits or deficits  Musculoskeletal: Negative straight leg raising bilaterally. No spinal malalignment present             Assessment & Plan:  #1 left lower quadrant pain with a past history of colon polyps and diverticulosis. Clinically low-grade diverticulitis suggested.   #2Perineal pain; probable referred from diverticular disease. Less likely would be acute prostatitis although he had increased amounts of alcohol last week  Plan: See orders and recommendations

## 2012-08-25 NOTE — Patient Instructions (Addendum)
Drink as much nondairy fluids as possible. Avoid spicy foods or alcohol as  these may aggravate your condition. Do not take decongestants. Avoid narcotics if possible.   If you activate My Chart; the results can be released to you as soon as they populate from the lab. If you choose not to use this program; the labs have to be reviewed, copied & mailed   causing a delay in getting the results to you.

## 2012-08-26 LAB — CBC WITH DIFFERENTIAL/PLATELET
Basophils Absolute: 0 10*3/uL (ref 0.0–0.1)
Basophils Relative: 0.4 % (ref 0.0–3.0)
Eosinophils Absolute: 0.3 10*3/uL (ref 0.0–0.7)
Hemoglobin: 13.3 g/dL (ref 13.0–17.0)
Lymphocytes Relative: 29.3 % (ref 12.0–46.0)
MCHC: 32.8 g/dL (ref 30.0–36.0)
Monocytes Relative: 7.8 % (ref 3.0–12.0)
Neutro Abs: 4.8 10*3/uL (ref 1.4–7.7)
Neutrophils Relative %: 58.8 % (ref 43.0–77.0)
RBC: 4.62 Mil/uL (ref 4.22–5.81)
RDW: 13.3 % (ref 11.5–14.6)

## 2012-08-27 LAB — URINE CULTURE: Colony Count: 2000

## 2012-11-07 ENCOUNTER — Other Ambulatory Visit: Payer: Self-pay | Admitting: Internal Medicine

## 2012-11-07 NOTE — Telephone Encounter (Signed)
Rx sent.    MW 

## 2012-11-29 ENCOUNTER — Ambulatory Visit (INDEPENDENT_AMBULATORY_CARE_PROVIDER_SITE_OTHER): Payer: Commercial Managed Care - PPO | Admitting: Internal Medicine

## 2012-11-29 ENCOUNTER — Encounter: Payer: Self-pay | Admitting: Internal Medicine

## 2012-11-29 VITALS — BP 132/70 | HR 53 | Temp 97.9°F | Wt 209.8 lb

## 2012-11-29 DIAGNOSIS — M7541 Impingement syndrome of right shoulder: Secondary | ICD-10-CM

## 2012-11-29 NOTE — Progress Notes (Signed)
  Subjective:    Patient ID: Shane Walter, male    DOB: 04/20/1946, 66 y.o.   MRN: [id number]  HPI Extremity pain Location: @superior  R shoulder Onset:2-3 months Trigger/injury:no Pain quality:sharp Pain severity: up to 8 Duration:seconds,position related Radiation:no Exacerbating factors:RLDP ; using mouse @ computer. Previously with elevation but resolved with exercises Treatment/response: exercises     Review of Systems Constitutional: no fever, chills, sweats, change in weight  Musculoskeletal:no  muscle cramps or pain; no  joint stiffness, redness, or swelling Skin:no rash, color/temp change Neuro: no weakness; incontinence (stool/urine); numbness and tingling Heme:no lymphadenopathy; abnormal bruising or bleeding      Objective:   Physical Exam  He is healthy , well-nourished & in no distress  Neck range of motion is normal. There is no tenderness to palpation of the trapezius muscles.  Deep tendon reflexes, strength and tone in the upper extremities is normal  Radial artery pulses are normal.  Skin reveals no suggestive lesions. Pea sized lipoma L biceps area  No lymphadenopathy about the neck or axilla   Range of motion the shoulders is essentially equal bilaterally and only minimally reduced. He has his  pain with elevation & extension of the right upper extremity         Assessment & Plan:  #1 right shoulder impingement syndrome; rule out partial rotator cuff  tear  Plan: See orders and recommendations

## 2012-11-29 NOTE — Patient Instructions (Addendum)
Use an anti-inflammatory cream such as Aspercreme or Zostrix cream twice a day to the R shoulder as needed. In lieu of this warm moist compresses or  hot water bottle can be used. Do not apply ice.  Review and correct the record as indicated. Please share record with all medical staff seen.

## 2013-01-27 ENCOUNTER — Telehealth: Payer: Self-pay | Admitting: Internal Medicine

## 2013-01-27 NOTE — Telephone Encounter (Signed)
pt called offered next available appt he refused as to speak with Shane Walter- pt has noted blood in toilet wt/left side pain x 3-days, also offered appt at [facility] as well and patient stated he only wants to speak with hopp cb# [phone]

## 2013-01-27 NOTE — Telephone Encounter (Signed)
Per Dr.Hopper patient needs to be seen at Urgent Care or ER. I called patient and gave him Dr.Hopper's recommendations. Patient states he had bad experiences with urgent care's. I gave patient instruction to the [facility], patient verbalized understanding of instructions and stated "I will go if it gets worse."   I will send message to Dr.Hopper as a Lorain Childes

## 2013-02-01 ENCOUNTER — Ambulatory Visit (INDEPENDENT_AMBULATORY_CARE_PROVIDER_SITE_OTHER): Payer: Commercial Managed Care - PPO | Admitting: Internal Medicine

## 2013-02-01 ENCOUNTER — Encounter: Payer: Self-pay | Admitting: Internal Medicine

## 2013-02-01 VITALS — BP 130/82 | HR 63 | Temp 98.4°F | Wt 203.0 lb

## 2013-02-01 DIAGNOSIS — K625 Hemorrhage of anus and rectum: Secondary | ICD-10-CM

## 2013-02-01 DIAGNOSIS — R319 Hematuria, unspecified: Secondary | ICD-10-CM

## 2013-02-01 DIAGNOSIS — R109 Unspecified abdominal pain: Secondary | ICD-10-CM

## 2013-02-01 DIAGNOSIS — K5730 Diverticulosis of large intestine without perforation or abscess without bleeding: Secondary | ICD-10-CM

## 2013-02-01 DIAGNOSIS — D126 Benign neoplasm of colon, unspecified: Secondary | ICD-10-CM

## 2013-02-01 LAB — POCT URINALYSIS DIPSTICK
Leukocytes, UA: NEGATIVE
Nitrite, UA: NEGATIVE
Protein, UA: NEGATIVE
Urobilinogen, UA: 0.2
pH, UA: 6

## 2013-02-01 LAB — CBC WITH DIFFERENTIAL/PLATELET
Basophils Absolute: 0 10*3/uL (ref 0.0–0.1)
Eosinophils Absolute: 0.2 10*3/uL (ref 0.0–0.7)
HCT: 40.1 % (ref 39.0–52.0)
Lymphs Abs: 2.2 10*3/uL (ref 0.7–4.0)
MCV: 85.2 fl (ref 78.0–100.0)
Monocytes Absolute: 0.6 10*3/uL (ref 0.1–1.0)
Neutrophils Relative %: 63.1 % (ref 43.0–77.0)
Platelets: 201 10*3/uL (ref 150.0–400.0)
RDW: 13.1 % (ref 11.5–14.6)
WBC: 8.3 10*3/uL (ref 4.5–10.5)

## 2013-02-01 MED ORDER — CIPROFLOXACIN HCL 500 MG PO TABS: 500.0000 mg | ORAL_TABLET | Freq: Two times a day (BID) | ORAL | Status: DC

## 2013-02-01 MED ORDER — METRONIDAZOLE 500 MG PO TABS: 500.0000 mg | ORAL_TABLET | Freq: Three times a day (TID) | ORAL | Status: DC

## 2013-02-01 NOTE — Patient Instructions (Addendum)
Please complete & return stool cards .  If you activate the  My Chart system; lab & Xray results will be released directly  to you as soon as I receive these. If you choose not to use this program; these have to be reviewed, copied & mailed, causing a delay in getting the results to you. As per [place] all records must be reviewed and signed off within 72 hours. If you've not signed up for My Chart within 48 hours;results will have been addressed &  you will have to wait for results to be reviewed and mailed. Additionally you can use this system to gain direct  access to your records  if  out of town or @ an office of a  physician who is not in  the My Chart network.  This improves continuity of care & places you in control of your medical record. [employer]

## 2013-02-01 NOTE — Progress Notes (Signed)
  Subjective:    Patient ID: Shane Walter, male    DOB: 1946-04-28, 67 y.o.   MRN: [id number]  HPI Abdominal pain has been chronic for years but exacerbated last week  in  L lateral abdomen  ; it was described as dull  and radiating posteriorly to L flank. Severity was up to a level 6 ; the discomfort lasted <30 minutes . It was not impacted by change in position,or urination.It improved with  bowel movement & drinking cold water . There were no definite exacerbating factors. No treatment otherwise.  Rectal bleeding once, described as streak of blood in toilet & on shorts. Fever & chills  were not present. Night sweats 3 nights in a row.  Past medical history of colon polyps X 2 & diverticulosis. Family history are negative for significant gastrointestinal diseases such as ulcer, reflux,  colitis,  colon polyps, or colon cancer.          Review of Systems No reported nausea vomiting, constipation, diarrhea,or melena. There was no associated dyspepsia, dysphagia, anorexia, or hematemesis. No significant weight change noted.  Dysuria, pyuria, and hematuria were absent. There was no associated rash or definite radicular pain in the area of the discomfort.     Objective:   Physical Exam Gen.: Healthy and well-nourished in appearance. Alert, appropriate and cooperative throughout exam.  Eyes: No corneal or conjunctival inflammation noted. No icterus. Mouth: Oral mucosa and oropharynx reveal no lesions or exudates. Teeth in good repair. Neck: No deformities, masses, or tenderness noted.  Thyroid normal. Lungs: Normal respiratory effort; chest expands symmetrically. Lungs are clear to auscultation without rales, wheezes, or increased work of breathing. Heart: Normal rate and rhythm. Normal S1 and S2. No gallop, click, or rub. Grade 1/2 over 6 systolic murmur. Abdomen: Bowel sounds normal; abdomen soft  But tender LLQ. No masses, organomegaly or hernias noted.        Musculoskeletal/extremities: No deformity or scoliosis noted of  the thoracic or lumbar spine. No clubbing, cyanosis, edema, or significant extremity  deformity noted. Joints  reveal minor DJD DIP changes. Nail health good. Able to lie down & sit up w/o help.  Vascular: Carotid, radial artery, dorsalis pedis and  posterior tibial pulses are full and equal. No bruits present. Neurologic: Alert and oriented x3.  Skin: Intact without suspicious lesions or rashes. Lymph: No cervical, axillary lymphadenopathy present. Psych: Mood and affect are normal. Normally interactive                                                                                        Assessment & Plan:  #1 abdominal pain left lateral abdomen in the context of history of colon polyps diverticulosis. He's also had sweats and rectal bleeding. Most likely etiology would be diverticular disease.  #2 coronary disease. Clinically he does not have ischemic bowel.  Plan: See orders and recommendations

## 2013-02-01 NOTE — Addendum Note (Signed)
Addended by: Silvio Pate D on: 02/01/2013 10:42 AM   Modules accepted: Orders

## 2013-02-03 LAB — URINE CULTURE: Colony Count: NO GROWTH

## 2013-02-06 ENCOUNTER — Encounter: Payer: Self-pay | Admitting: *Deleted

## 2013-02-27 ENCOUNTER — Encounter: Payer: Commercial Managed Care - PPO | Admitting: Internal Medicine

## 2013-02-28 ENCOUNTER — Other Ambulatory Visit: Payer: Self-pay | Admitting: Internal Medicine

## 2013-03-01 NOTE — Telephone Encounter (Signed)
Patient with pending appointment

## 2013-03-14 ENCOUNTER — Other Ambulatory Visit (INDEPENDENT_AMBULATORY_CARE_PROVIDER_SITE_OTHER): Payer: Commercial Managed Care - PPO

## 2013-03-14 DIAGNOSIS — Z1289 Encounter for screening for malignant neoplasm of other sites: Secondary | ICD-10-CM

## 2013-03-14 DIAGNOSIS — Z0000 Encounter for general adult medical examination without abnormal findings: Secondary | ICD-10-CM

## 2013-03-14 LAB — HEMOCCULT GUIAC POC 1CARD (OFFICE): Card #3 Fecal Occult Blood, POC: NEGATIVE

## 2013-04-25 ENCOUNTER — Telehealth: Payer: Self-pay | Admitting: Cardiology

## 2013-04-25 ENCOUNTER — Telehealth: Payer: Self-pay | Admitting: General Practice

## 2013-04-25 NOTE — Telephone Encounter (Signed)
Pt called regarding his surgical clearance paperwork. Pt was notified per Alfonse Flavors that he needed clearance from his cardiologist. If they approved surgery Hopp would sign off also.

## 2013-04-25 NOTE — Telephone Encounter (Signed)
New problem    Pt wants to speak to you regarding surgery per dr hooper-tried to sched appt for surgical clearance but pt did not want to before speaking to someone-wanted to know if clearance could be given over the phone

## 2013-04-28 ENCOUNTER — Telehealth: Payer: Self-pay | Admitting: Internal Medicine

## 2013-04-28 NOTE — Telephone Encounter (Signed)
Patient called requesting that we make an appointment for him to see Dr. Shirlee Latch for surgical clearance. He called their office yesterday to make an appointment and was told Dr. Shirlee Latch could not see him until September.

## 2013-04-28 NOTE — Telephone Encounter (Signed)
Pt is having surgery on Right Shoulder for a bone spur that tore ligament in two places. Pt stated he had tried. Dr. Alford Highland office to schedule an appt as Dr. Alwyn Ren had recommended. Called [facility] no answer. Will call back after lunch.

## 2013-05-01 NOTE — Telephone Encounter (Signed)
Pt was told he could see the PA in June, Pt declined this appt stating that he wanted to see the Physician who is unavailable until September.

## 2013-05-02 ENCOUNTER — Other Ambulatory Visit: Payer: Self-pay | Admitting: Internal Medicine

## 2013-05-02 DIAGNOSIS — I2510 Atherosclerotic heart disease of native coronary artery without angina pectoris: Secondary | ICD-10-CM

## 2013-05-02 DIAGNOSIS — M7540 Impingement syndrome of unspecified shoulder: Secondary | ICD-10-CM

## 2013-05-02 NOTE — Telephone Encounter (Signed)
Patient spouse calling back asking that Dr. Alwyn Ren or his nurse please schedule patient with [facility] for surgical clearance.  She states that when they tried, Dr. Alford Highland office would not schedule appointment for them.

## 2013-05-02 NOTE — Telephone Encounter (Signed)
Called [facility] and scheduled pt with the PA Tereso Newcomer on 05/25/2013 at 2:20pm. This was the same appt the pt had been offered earlier today. Pt notified of the appt date and time and expressed he would be there.

## 2013-05-02 NOTE — Telephone Encounter (Signed)
appt given to pt for 6/5 with Lilian Coma

## 2013-05-02 NOTE — Telephone Encounter (Signed)
Follow-up:    Called in wanting to schedule the patient to see Dr. Shirlee Latch for a surgical clearance appointment.  Please call back.

## 2013-05-10 ENCOUNTER — Ambulatory Visit (INDEPENDENT_AMBULATORY_CARE_PROVIDER_SITE_OTHER): Payer: Commercial Managed Care - PPO | Admitting: Internal Medicine

## 2013-05-10 ENCOUNTER — Encounter: Payer: Self-pay | Admitting: Internal Medicine

## 2013-05-10 VITALS — BP 126/78 | HR 57 | Temp 98.1°F | Resp 12 | Ht 70.25 in | Wt 202.0 lb

## 2013-05-10 DIAGNOSIS — I2510 Atherosclerotic heart disease of native coronary artery without angina pectoris: Secondary | ICD-10-CM

## 2013-05-10 DIAGNOSIS — E785 Hyperlipidemia, unspecified: Secondary | ICD-10-CM

## 2013-05-10 DIAGNOSIS — I10 Essential (primary) hypertension: Secondary | ICD-10-CM

## 2013-05-10 DIAGNOSIS — R7309 Other abnormal glucose: Secondary | ICD-10-CM

## 2013-05-10 DIAGNOSIS — M7541 Impingement syndrome of right shoulder: Secondary | ICD-10-CM

## 2013-05-10 LAB — BASIC METABOLIC PANEL
Chloride: 105 mEq/L (ref 96–112)
Creatinine, Ser: 1 mg/dL (ref 0.4–1.5)
Potassium: 4.2 mEq/L (ref 3.5–5.1)
Sodium: 139 mEq/L (ref 135–145)

## 2013-05-10 LAB — HEPATIC FUNCTION PANEL
ALT: 28 U/L (ref 0–53)
AST: 25 U/L (ref 0–37)
Albumin: 4.1 g/dL (ref 3.5–5.2)
Alkaline Phosphatase: 39 U/L (ref 39–117)
Bilirubin, Direct: 0 mg/dL (ref 0.0–0.3)
Total Bilirubin: 0.7 mg/dL (ref 0.3–1.2)
Total Protein: 7.6 g/dL (ref 6.0–8.3)

## 2013-05-10 LAB — LIPID PANEL
Cholesterol: 156 mg/dL (ref 0–200)
HDL: 43.6 mg/dL (ref >39.00)
LDL Cholesterol: 90 mg/dL (ref 0–99)
Total CHOL/HDL Ratio: 4
Triglycerides: 114 mg/dL (ref 0.0–149.0)
VLDL: 22.8 mg/dL (ref 0.0–40.0)

## 2013-05-10 LAB — HEMOGLOBIN A1C: Hgb A1c MFr Bld: 6.4 % (ref 4.6–6.5)

## 2013-05-10 LAB — TSH: TSH: 0.3 u[IU]/mL — ABNORMAL LOW (ref 0.35–5.50)

## 2013-05-10 NOTE — Progress Notes (Signed)
Subjective:    Patient ID: Shane Walter, male    DOB: 02/18/1946, 67 y.o.   MRN: [id number]  HPI He was originally scheduled for an annual exam; he has been evaluated by Dr. Shelle Iron, orthopedist who feels that he needs surgery on his right shoulder. This may include a right rotator cuff tear repair, bursectomy, and possible removal of spurs.  The symptoms started a year ago as positional pain which also awakens him at night several times. It is described as sharp and nonradiating.  He saw Dr. Shelle Iron who diagnosed a spur on MRI. Cortisone injection did not benefit his condition.  He has no past history of any complications with surgeries or anesthesia  Significant is his history   coronary artery disease for which he had bypass grafting of 3 vessels in May 2009.Family history is negative for premature coronary disease.     Review of Systems He is on a modified heart healthy & decreased sugar diet; he has not exercised for 2 weeks due to shoulder issues. Prior he did treadmill 35 minutes 3-4 times per week without symptoms. Specifically he denies chest pain, palpitations, dyspnea, or claudication. Because of his CAD  his LDL goal is less than 70.      Objective:   Physical Exam Gen.:  well-nourished in appearance. Alert, appropriate and cooperative throughout exam. Head: Normocephalic without obvious abnormalities  Eyes: No corneal or conjunctival inflammation noted. Arcus Nose: External nasal exam reveals no deformity or inflammation. Nasal mucosa are pink and moist. No lesions or exudates noted.   Mouth: Oral mucosa and oropharynx reveal no lesions or exudates. Teeth in good repair. Neck: No deformities, masses, or tenderness noted. Range of motion & Thyroid normal. Lungs: Normal respiratory effort; chest expands symmetrically. Lungs are clear to auscultation without rales, wheezes, or increased work of breathing. Heart: Normal rate and rhythm. Normal S1 ;accentuated  S2. No gallop,  click, or rub.Slight LUSB slurring. Abdomen: Bowel sounds normal; abdomen soft and nontender. No masses or organomegaly.                       Musculoskeletal/extremities:There is some asymmetry of the lower posterior thoracic musculature suggesting occult scoliosis. No clubbing, cyanosis, edema, or significant extremity  deformity noted. Range of motion normal .Tone & strength  Normal. Joints normal . Nail health good. Able to lie down & sit up w/o help. It hurts pain with elevation of the right upper extremity or with range of motion testing of the shoulder. Vascular: Carotid, radial artery, dorsalis pedis and  posterior tibial pulses are full and equal. No bruits present. Neurologic: Alert and oriented x3. Deep tendon reflexes symmetrical and normal.          Skin: Intact without suspicious lesions or rashes. Well-healed operative site keloids Lymph: No cervical, axillary lymphadenopathy present. Psych: Mood and affect are normal. Normally interactive                                                                                        Assessment & Plan:  #1 right shoulder impingement syndrome in the setting of spurs and probable rotator  cuff tear  #2 coronary disease, subjectively stable  Plan: See orders and recommendations.

## 2013-05-10 NOTE — Patient Instructions (Addendum)
Please review the record and make any corrections; share this with all medical staff seen.  If you activate the  My Chart system; lab & Xray results will be released directly  to you as soon as I review & address these through the computer. If you choose not to sign up for My Chart within 36 hours of labs being drawn; results will be reviewed & interpretation added before being copied & mailed, causing a delay in getting the results to you.If you do not receive that report within 7-10 days ,please call. Additionally you can use this system to gain direct  access to your records  if  out of town or @ an office of a  physician who is not in  the My Chart network.  This improves continuity of care & places you in control of your medical record.  

## 2013-05-16 ENCOUNTER — Other Ambulatory Visit: Payer: Self-pay | Admitting: Internal Medicine

## 2013-05-25 ENCOUNTER — Encounter: Payer: Self-pay | Admitting: Physician Assistant

## 2013-05-25 ENCOUNTER — Ambulatory Visit (INDEPENDENT_AMBULATORY_CARE_PROVIDER_SITE_OTHER): Payer: Commercial Managed Care - PPO | Admitting: Physician Assistant

## 2013-05-25 VITALS — BP 124/62 | HR 56 | Ht 70.25 in | Wt 204.8 lb

## 2013-05-25 DIAGNOSIS — I10 Essential (primary) hypertension: Secondary | ICD-10-CM

## 2013-05-25 DIAGNOSIS — I2510 Atherosclerotic heart disease of native coronary artery without angina pectoris: Secondary | ICD-10-CM

## 2013-05-25 DIAGNOSIS — Z01810 Encounter for preprocedural cardiovascular examination: Secondary | ICD-10-CM

## 2013-05-25 DIAGNOSIS — E785 Hyperlipidemia, unspecified: Secondary | ICD-10-CM

## 2013-05-25 NOTE — Patient Instructions (Signed)
Your physician has requested that you have an exercise tolerance test. For further information please visit [email or web address] Please also follow instruction sheet, as given.  Your physician wants you to follow-up in: 6 months. You will receive a reminder letter in the mail two months in advance. If you don't receive a letter, please call our office to schedule the follow-up appointment.

## 2013-05-25 NOTE — Progress Notes (Addendum)
[address], [address] [place], [place]  [address] Phone: [phone] Fax:  [phone]  Date:  05/25/2013   ID:  Shane Walter, DOB 30-Apr-1946, MRN [id number]  PCP:  Marga Melnick, MD  Cardiologist:  Dr. Marca Ancona     History of Present Illness: Shane Walter is a 67 y.o. Afghani male who returns for f/u and surgical clearance.  He has a hx of CAD, s/p CABG 04/2008, DM2, HTN.  Surgery was done at [facility].  He was previously followed at [facility].  Established with Dr. Marca Ancona in 02/2012.  Carotid (10/12) with no significant plaque.  Echo (10/12) with EF 55-60%, restrictive diastolic function, mild MR.  Since last seen, he is doing well.  The patient denies chest pain, shortness of breath, syncope, orthopnea, PND or significant pedal edema.  He needs right shoulder arthroscopy due to impingement syndrome.  Surgeon is Dr. Shelle Iron.   Labs (5/14):  K 4.2, Cr 1.0, ALT 28, LDL 90, TSH 0.30 (low)  Wt Readings from Last 3 Encounters:  05/25/13 204 lb 12.8 oz (92.897 kg)  05/10/13 202 lb (91.627 kg)  02/01/13 203 lb (92.08 kg)     Past Medical History  Diagnosis Date  . Varicose veins   . Diverticulosis 2011    Dr Marina Goodell  . Hyperlipidemia   . CAD (coronary artery disease)     a. 3-vessel CABG in 5/09 at [facility] after episode of unstable angina (L-LAD, RIMA-OM1, S-D1);  b.Echo (10/12) with EF 55-60%, restrictive diastolic function, mild MR.  . Gastritis     H. pylori +  . Colon polyp 2008     [facility]    Current Outpatient Prescriptions  Medication Sig Dispense Refill  . aspirin 81 MG tablet Take 81 mg by mouth daily.        . CRESTOR 20 MG tablet TAKE 1 TABLET (20 MG TOTAL) BY MOUTH DAILY.  30 tablet  5  . losartan (COZAAR) 100 MG tablet TAKE 1 TABLET (100 MG TOTAL) BY MOUTH DAILY.  30 tablet  5  . metoprolol succinate (TOPROL-XL) 25 MG 24 hr tablet TAKE 1 TABLET (25 MG TOTAL) BY MOUTH 2 (TWO) TIMES DAILY.  180 tablet  1  . Omega-3 Fatty Acids  (FISH OIL) 1000 MG CAPS Take 1,000 mg by mouth daily.        . vitamin A 27253 UNIT capsule Take 10,000 Units by mouth daily.      . [DISCONTINUED] montelukast (SINGULAIR) 10 MG tablet Take 1 tablet (10 mg total) by mouth at bedtime.  30 tablet  2   Current Facility-Administered Medications  Medication Dose Route Frequency Provider Last Rate Last Dose  . 0.9 %  sodium chloride infusion  500 mL Intravenous Continuous Hilarie Fredrickson, MD        Allergies:    Allergies  Allergen Reactions  . Iohexol      Contrast allergy reported on prior CT report with diffuse rash     Social History:  The patient  reports that he quit smoking about 37 years ago. He does not have any smokeless tobacco history on file. He reports that  drinks alcohol. He reports that he does not use illicit drugs.   ROS:  Please see the history of present illness.      All other systems reviewed and negative.   PHYSICAL EXAM: VS:  BP 124/62  Pulse 56  Ht 5' 10.25" (1.784 m)  Wt 204  lb 12.8 oz (92.897 kg)  BMI 29.19 kg/m2 Well nourished, well developed, in no acute distress HEENT: normal Neck: no JVD Vascular:  No carotid bruits Cardiac:  normal S1, S2; RRR; 1/6 systolic murmur along LSB Lungs:  clear to auscultation bilaterally, no wheezing, rhonchi or rales Abd: soft, nontender, no hepatomegaly Ext: no edema Skin: warm and dry Neuro:  CNs 2-12 intact, no focal abnormalities noted  EKG:  Sinus bradycardia, HR 56, normal axis, nonspecific ST-T wave changes, no change from prior tracings     ASSESSMENT AND PLAN:  1. Surgical Clearance: Patient does not have any unstable cardiac conditions.  However, it has been 7 years since his bypass surgery. He has not had an ischemic evaluation since that time. I will arrange an exercise treadmill test (POET). As long as this is low risk, he may proceed with his surgery at acceptable risk. 2. CAD: Proceed with followup exercise treadmill test. Continue aspirin, statin. Aspirin  may be held prior to surgery if needed and resumed afterward when felt safe. 3. Hypertension: Controlled. 4. Hyperlipidemia: Recent LDL optimal. Continue statin. 5. Disposition: Followup in 6 months.  Signed, Tereso Newcomer, PA-C  05/25/2013 3:05 PM    Addendum 06/01/2013 2:27 PM: ETT performed and notable for significant ST depression.  No chest pain and normal BP response.  Therefore, patient was set up for a [employer].  Lexiscan Myoview 6/14:  Low risk, fixed inferolateral defect, no ischemia, EF 56%.  Given low risk study and no ischemic symptoms, according to ACC/AHA guidelines, he may proceed with his non-cardiac surgery at acceptable risk.  No further testing needed.  Our service is available as necessary.  Signed,  Tereso Newcomer, PA-C   06/01/2013 2:28 PM

## 2013-05-29 ENCOUNTER — Ambulatory Visit (INDEPENDENT_AMBULATORY_CARE_PROVIDER_SITE_OTHER): Payer: Commercial Managed Care - PPO | Admitting: Physician Assistant

## 2013-05-29 DIAGNOSIS — R9439 Abnormal result of other cardiovascular function study: Secondary | ICD-10-CM

## 2013-05-29 DIAGNOSIS — I2510 Atherosclerotic heart disease of native coronary artery without angina pectoris: Secondary | ICD-10-CM

## 2013-05-29 DIAGNOSIS — Z951 Presence of aortocoronary bypass graft: Secondary | ICD-10-CM

## 2013-05-29 DIAGNOSIS — Z01810 Encounter for preprocedural cardiovascular examination: Secondary | ICD-10-CM

## 2013-05-29 NOTE — Patient Instructions (Addendum)
PLEASE SCHEDULE TO HAVE A LEXISCAN MYOVIEW; DX SURG CLEARANCE, ABNORMAL GXT, S/P CABG

## 2013-05-29 NOTE — Progress Notes (Signed)
Exercise Treadmill Test  Pre-Exercise Testing Evaluation Rate: 58       Test  Exercise Tolerance Test Ordering MD: Marca Ancona, MD  Interpreting MD: Tereso Newcomer, PA_C  Unique Test No: 1  Treadmill:  1  Indication for ETT: known ASHD  Contraindication to ETT: No   Stress Modality: exercise - treadmill  Cardiac Imaging Performed: non   Protocol: standard Bruce - maximal  Max BP:  225/76  Max MPHR (bpm):  153 85% MPR (bpm):  130  MPHR obtained (bpm):  146 % MPHR obtained:  95  Reached 85% MPHR (min:sec):  7:00 Total Exercise Time (min-sec):  8:00  Workload in METS:  10.1 Borg Scale: 16  Reason ETT Terminated:  desired heart rate attained    ST Segment Analysis At Rest: normal ST segments - no evidence of significant ST depression With Exercise: significant ischemic ST depression  Other Information Arrhythmia:  No Angina during ETT:  absent (0) Quality of ETT:  diagnostic  ETT Interpretation:  abnormal - evidence of ST depression consistent with ischemia  Comments: Good exercise tolerance. No chest pain. Normal BP response to exercise. There was significant inferolateral ST depression.   Recommendations: Patient here for routine ETT.  He had no anginal symptoms. His exercise tolerance is good. ECGs are abnormal. Reviewed ECGs with Dr. Delane Ginger (DOD). Will proceed with Lexiscan Myoview to rule out significant ischemia. Will await results of Myoview before giving opinion on his risk for surgery.  Signed,  Tereso Newcomer, PA-C   05/29/2013 2:07 PM

## 2013-05-30 ENCOUNTER — Ambulatory Visit (HOSPITAL_COMMUNITY): Payer: Commercial Managed Care - PPO | Attending: Cardiovascular Disease | Admitting: Radiology

## 2013-05-30 VITALS — BP 142/80 | Ht 70.0 in | Wt 199.0 lb

## 2013-05-30 DIAGNOSIS — Z951 Presence of aortocoronary bypass graft: Secondary | ICD-10-CM | POA: Insufficient documentation

## 2013-05-30 DIAGNOSIS — E785 Hyperlipidemia, unspecified: Secondary | ICD-10-CM | POA: Insufficient documentation

## 2013-05-30 DIAGNOSIS — I2510 Atherosclerotic heart disease of native coronary artery without angina pectoris: Secondary | ICD-10-CM

## 2013-05-30 DIAGNOSIS — Z87891 Personal history of nicotine dependence: Secondary | ICD-10-CM | POA: Insufficient documentation

## 2013-05-30 DIAGNOSIS — Z8249 Family history of ischemic heart disease and other diseases of the circulatory system: Secondary | ICD-10-CM | POA: Insufficient documentation

## 2013-05-30 DIAGNOSIS — Z01810 Encounter for preprocedural cardiovascular examination: Secondary | ICD-10-CM | POA: Insufficient documentation

## 2013-05-30 DIAGNOSIS — R9431 Abnormal electrocardiogram [ECG] [EKG]: Secondary | ICD-10-CM

## 2013-05-30 DIAGNOSIS — R5381 Other malaise: Secondary | ICD-10-CM | POA: Insufficient documentation

## 2013-05-30 DIAGNOSIS — R61 Generalized hyperhidrosis: Secondary | ICD-10-CM | POA: Insufficient documentation

## 2013-05-30 DIAGNOSIS — R9439 Abnormal result of other cardiovascular function study: Secondary | ICD-10-CM

## 2013-05-30 DIAGNOSIS — E119 Type 2 diabetes mellitus without complications: Secondary | ICD-10-CM | POA: Insufficient documentation

## 2013-05-30 MED ORDER — REGADENOSON 0.4 MG/5ML IV SOLN
0.4000 mg | Freq: Once | INTRAVENOUS | Status: AC
  Administered 2013-05-30: 0.4 mg via INTRAVENOUS

## 2013-05-30 MED ORDER — TECHNETIUM TC 99M SESTAMIBI GENERIC - CARDIOLITE
11.0000 | Freq: Once | INTRAVENOUS | Status: AC | PRN
  Administered 2013-05-30: 11 via INTRAVENOUS

## 2013-05-30 MED ORDER — TECHNETIUM TC 99M SESTAMIBI GENERIC - CARDIOLITE
33.0000 | Freq: Once | INTRAVENOUS | Status: AC | PRN
  Administered 2013-05-30: 33 via INTRAVENOUS

## 2013-05-30 NOTE — Progress Notes (Signed)
[facility] SITE 3 NUCLEAR MED [address] [place], [place] [address] [phone]    Cardiology Nuclear Med Study  WAYLAND BAIK is a 67 y.o. male     MRN : [id number]     DOB: 04/28/1946  Procedure Date: 05/30/2013  Nuclear Med Background Indication for Stress Test:  Evaluation for Ischemia, Abnormal GXT;Pending Surgical Clearance: Right Shoulder surgery-Dr. Shelle Iron, and Graft Patency History:  '09 Heart Catheterization:CABGx3;05/29/13 GXT: Abnormal with significant inferolateral depression Cardiac Risk Factors: Family History - CAD, History of Smoking, Lipids and NIDDM  Symptoms:  Diaphoresis and Fatigue   Nuclear Pre-Procedure Caffeine/Decaff Intake:  None NPO After: 7:30am   Lungs:  clear O2 Sat: 95% on room air. IV 0.9% NS with Angio Cath:  20g  IV Site: R Antecubital  IV Started by:  Stanton Kidney, EMT-P  Chest Size (in):  42 Cup Size: n/a  Height: 5\' 10"  (1.778 m)  Weight:  199 lb (90.266 kg)  BMI:  Body mass index is 28.55 kg/(m^2). Tech Comments:  Meds were taken at 7:30am, per patient.    Nuclear Med Study 1 or 2 day study: 1 day  Stress Test Type:  Eugenie Birks  Reading MD: Kristeen Miss, MD  Order Authorizing Provider:  Fransico Meadow and Lorin Picket [facility]  Resting Radionuclide: Technetium 4m Sestamibi  Resting Radionuclide Dose: 11.0 mCi   Stress Radionuclide:  Technetium 2m Sestamibi  Stress Radionuclide Dose: 33.0 mCi           Stress Protocol Rest HR: 53 Stress HR: 56  Rest BP: 142/80 Stress BP: 145/84  Exercise Time (min): n/a METS: n/a   Predicted Max HR: 153 bpm % Max HR: 36.6 bpm Rate Pressure Product: 8120   Dose of Adenosine (mg):  n/a Dose of Lexiscan: 0.4 mg  Dose of Atropine (mg): n/a Dose of Dobutamine: n/a mcg/kg/min (at max HR)  Stress Test Technologist: Cathlyn Parsons, RN  Nuclear Technologist:  Doyne Keel, CNMT     Rest Procedure:  Myocardial perfusion imaging was performed at rest 45 minutes following the intravenous  administration of Technetium 21m Sestamibi. Rest ECG: sinus brady, TWI in aVL,   Stress Procedure:  The patient received IV Lexiscan 0.4 mg over 15-seconds.  Technetium 71m Sestamibi injected at 30-seconds.  Quantitative spect images were obtained after a 45 minute delay. Stress ECG: No significant change from baseline ECG  QPS Raw Data Images:  Normal; no motion artifact; normal heart/lung ratio. Stress Images:  There is a large area of mild attenuation of the inferiolateral wall.  the uptake in the other regions is normal.   Rest Images:  There is a large area of mild attenuation of the inferiolateral wall.  the uptake in the other regions is normal.  Subtraction (SDS):  No evidence of ischemia. Transient Ischemic Dilatation (Normal <1.22):  0.99 Lung/Heart Ratio (Normal <0.45):  0.27  Quantitative Gated Spect Images QGS EDV:  121 ml QGS ESV:  54 ml  Impression Exercise Capacity:  Lexiscan with no exercise. BP Response:  Normal blood pressure response. Clinical Symptoms:  No significant symptoms noted. ECG Impression:  No significant ST segment change suggestive of ischemia. Comparison with Prior Nuclear Study: No images to compare  Overall Impression:  Low risk stress nuclear study There is mild, fixed attenuation of the inferiolateral wall.  There is no evidence of ischemia.   .  LV Ejection Fraction: 56%.  LV Wall Motion:  NL LV Function; NL Wall Motion.   Alvia Grove.,  MD, [facility] 05/30/2013, 3:42 PM Office - [phone] Pager [phone]

## 2013-06-01 ENCOUNTER — Encounter: Payer: Self-pay | Admitting: Physician Assistant

## 2013-06-01 ENCOUNTER — Telehealth: Payer: Self-pay | Admitting: *Deleted

## 2013-06-01 NOTE — Telephone Encounter (Signed)
lmom normal myoview and is cleared for surgery with Dr. Shelle Iron. I will fax myoview results to surgeon today

## 2013-06-01 NOTE — Telephone Encounter (Signed)
Message copied by Tarri Fuller on Thu Jun 01, 2013  5:40 PM ------      Message from: [place], [place] T      Created: Thu Jun 01, 2013  2:26 PM       Please inform patient stress test normal.      He may proceed with surgery at acceptable risk.      Please fax copy of note to surgeon.      Tereso Newcomer, PA-C  2:26 PM 06/01/2013 ------

## 2013-06-02 ENCOUNTER — Telehealth: Payer: Self-pay | Admitting: Physician Assistant

## 2013-06-02 NOTE — Telephone Encounter (Signed)
cb pt to let him know that I did lmom on his [phone] # stress test normsl, cleared for surgery and results faxed to Dr. Beane yestersday at 5:44 pm. pt said he was sorry he did not ge tthe message, but has been advised today and said thank you. 

## 2013-06-02 NOTE — Telephone Encounter (Signed)
cb pt to let him know that I did lmom on his [phone] # stress test normsl, cleared for surgery and results faxed to Dr. Shelle Iron yestersday at 5:44 pm. pt said he was sorry he did not ge tthe message, but has been advised today and said thank you.

## 2013-06-02 NOTE — Telephone Encounter (Signed)
New problem  Pt is wanting the results of his test he had done on Tuesday.

## 2013-06-06 ENCOUNTER — Other Ambulatory Visit: Payer: Self-pay | Admitting: Orthopedic Surgery

## 2013-06-06 NOTE — H&P (Signed)
Shane Walter is an 67 y.o. male.   Chief Complaint: right shoulder pain HPI: The patient is a 67 year old male being followed for their right shoulder pain. They are 12.5 months out from when symptoms began. Symptoms reported today include: pain and pain with overhead motions, while the patient does not report symptoms of: popping, weakness or numbness. Refractory to home exercise program, subacromial steroid injection, NSAIDs, rest, and activity modification. He notes pain through ROM R shoulder. He has had to stop reaching behind him (IR) on the right due to pain and notes that ER and abduction are both painful. The HEP made his pain worse so he has stopped doing that. He is waking up at night from pain. Even walking, and swinging his arms, has become painful. Past Medical History  Diagnosis Date  . Varicose veins   . Diverticulosis 2011    Dr Marina Goodell  . Hyperlipidemia   . CAD (coronary artery disease)     a. 3-vessel CABG in 5/09 at [facility] after episode of unstable angina (L-LAD, RIMA-OM1, S-D1);  b.Echo (10/12) with EF 55-60%, restrictive diastolic function, mild MR.;  c.  Lexiscan Myoview 6/14:  Low risk, fixed inferolateral defect, no ischemia, EF 56% (of note ETT with ST depression but myoview normal)  . Gastritis     H. pylori +  . Colon polyp 2008     [facility]    Past Surgical History  Procedure Laterality Date  . Coronary artery bypass graft      04/2008- three vessels, [facility], Dr. Ty Hilts  . Colonoscopy w/ polypectomy  2008    [facility]  . Cardiac catheterization      NEG  . Cardiolite      MILD APICAL ISCHEMIA  . Colonoscopy with polypectomy      Dr Marina Goodell    Family History  Problem Relation Age of Onset  . Heart attack Neg Hx   . Stroke Neg Hx   . Diabetes Neg Hx   . Heart disease Brother     "1/2 brother";CABG  . Breast cancer      1/2 sister  . Colon cancer Neg Hx    Social History:  reports that he quit smoking about 37 years ago. He does not have any smokeless  tobacco history on file. He reports that  drinks alcohol. He reports that he does not use illicit drugs.  Allergies:  Allergies  Allergen Reactions  . Iohexol      Contrast allergy reported on prior CT report with diffuse rash      (Not in a hospital admission)  No results found for this or any previous visit (from the past 48 hour(s)). No results found.  Review of Systems  Constitutional: Negative.   HENT: Negative.   Eyes: Negative.   Respiratory: Negative.   Cardiovascular: Negative.   Gastrointestinal: Negative.   Genitourinary: Negative.   Musculoskeletal: Positive for joint pain.  Skin: Negative.   Neurological: Negative.   Endo/Heme/Allergies: Negative.   Psychiatric/Behavioral: Negative.     There were no vitals taken for this visit. Physical Exam  Constitutional: He is oriented to person, place, and time. He appears well-developed and well-nourished.  HENT:  Head: Normocephalic and atraumatic.  Eyes: Conjunctivae and EOM are normal. Pupils are equal, round, and reactive to light.  Neck: Normal range of motion. Neck supple.  Cardiovascular: Normal rate and regular rhythm.   Respiratory: Effort normal and breath sounds normal.  GI: Soft. Bowel sounds are normal.  Musculoskeletal:  Right Shoulder: Inspection and Palpation:Tenderness- anterior joint line tender to palpation. no tenderness to palpation of the [facility] joint, no tenderness to palpation of the clavicle and no tenderness to palpation of the subacromial space. Swelling- none. Effusion- none. Crepitus- No None. Muscle tone- no periarticular muscle atrophy noted. Sensation- intact to light touch. Skin:Color- no ecchymosis and no erythema. ROM: Internal Rotation:AROM- painful and within 5% of the contralateral extremity. External Rotation:AROM- within 10% of the contralateral extremity and painful. Flexion:AROM- full and painful. Glenohumeral Abduction:AROM- full and painful. Strength and  Tone:Biceps- 5/5. Deltoid- normal. Triceps- 5/5. Flexion- 5/5. Extension- 5/5. Abduction- 5/5. Internal Rotation- 5/5. External Rotation- 5/5. Rotator Cuff- normal. Instability- sulcus sign negative. Impingement- impingement sign positive and secondary impingement sign positive. Deformities/Malalignments/Discrepancies- no deformities noted. Special Testing- Speed's test negative.  Neurological: He is alert and oriented to person, place, and time. He has normal reflexes.  Skin: Skin is warm and dry.  Psychiatric: He has a normal mood and affect.    MRI R shoulder with supraspinatus and infraspinatus tendinosis; articular surface tear mid to anterior supraspinatus footprint with full thickness extension anteriorly and posteriorly (articular surface), and 2mm retraction of the far anterior bursal fibers. AC arthrosis and hypertrophy with undersurface spurring consistent with impingement.  prior xrays from 12/13 reviewed with type 3 acromion, undersurface spurring. Mild AC and GH DJD.  Assessment/Plan R shoulder impingement, RCT, adhesive capsulitis  R shoulder pain 1 year duration consistent with impingement, with RCT seen on MRI, beginning to develop early adhesive capsulitis. Symptoms refractory to HEP, NSAIDs, activity modification, and subacromial steroid injection. At this point, recommend proceeding with surgical intervention, a right shoulder arthroscopy, SAD, bursectomy, mini-open RCR, exam under anesthesia, possible manipulation under anesthesia. Discussed surgery itself as well as risks, complications, and alternatives including but not limited to DVT, PE, infx, bleeding, failure of procedure, need for secondary procedure, anesthesia risk, even death. Discussed expected outcome and post-op course. Anatomic models and illustrations/handouts used. All questions answered, he desires to proceed with surgery. Will obtain pre-op clearance from PCP Dr. Alwyn Ren. Continue with activity  modifications in the interim to avoid exacerbation. Discussed importance of long-term activity modification to prevent recurrence or exacerbation. His wife will be returning from [place] later this month so he plans to schedule surgery once she is here to have help at home. He will follow up 10-14 days post-op for suture removal. He will call with any questions or concerns in the interim.  I had a long discussion with the patient concerning the risks and benefits of a rotator cuff repair, including bleeding, infection, prolonged postoperative recovery, which may require 3 to 5 months until maximum medical improvement. Overight procedure with initiation of early passive range of motion within physical therapy. Avoid any active motion for the first six weeks. This is all in an effort to avoid recurrent tear of the rotator cuff and adhesive capsulitis. Return to work without use of the arm can be obtained following two weeks. However, driving will be a challenge. We also discussed the possibility of requiring implants including bone anchors,as well as an Allograft patch graft if a massive rotator cuff tear is encountered. Removal of any bones for spurs as well as bursitis will be performed during the procedure and also any associated anesthetic complications as well.  Andrez Grime M. for Dr Shelle Iron 06/06/2013, 8:42 AM

## 2013-06-07 ENCOUNTER — Encounter (HOSPITAL_COMMUNITY): Payer: Self-pay | Admitting: Pharmacy Technician

## 2013-06-08 ENCOUNTER — Ambulatory Visit (HOSPITAL_COMMUNITY)
Admission: RE | Admit: 2013-06-08 | Discharge: 2013-06-08 | Disposition: A | Discharge status: Home / Self Care | Payer: Commercial Managed Care - PPO | Source: Ambulatory Visit | Attending: Specialist | Admitting: Specialist

## 2013-06-08 ENCOUNTER — Encounter (HOSPITAL_COMMUNITY)
Admission: RE | Admit: 2013-06-08 | Discharge: 2013-06-08 | Disposition: A | Discharge status: Home / Self Care | Payer: Commercial Managed Care - PPO | Source: Ambulatory Visit | Attending: Specialist | Admitting: Specialist

## 2013-06-08 ENCOUNTER — Encounter (HOSPITAL_COMMUNITY): Payer: Self-pay

## 2013-06-08 DIAGNOSIS — Z01818 Encounter for other preprocedural examination: Secondary | ICD-10-CM | POA: Insufficient documentation

## 2013-06-08 HISTORY — DX: Gastro-esophageal reflux disease without esophagitis: K21.9

## 2013-06-08 HISTORY — DX: Unspecified macular degeneration: H35.30

## 2013-06-08 HISTORY — DX: Headache: R51

## 2013-06-08 LAB — SURGICAL PCR SCREEN: MRSA, PCR: NEGATIVE

## 2013-06-08 LAB — CBC
HCT: 38.7 % — ABNORMAL LOW (ref 39.0–52.0)
Hemoglobin: 13.5 g/dL (ref 13.0–17.0)
MCH: 28.6 pg (ref 26.0–34.0)
MCHC: 34.9 g/dL (ref 30.0–36.0)
MCV: 82 fL (ref 78.0–100.0)
RBC: 4.72 MIL/uL (ref 4.22–5.81)

## 2013-06-08 LAB — BASIC METABOLIC PANEL
BUN: 16 mg/dL (ref 6–23)
CO2: 27 mEq/L (ref 19–32)
Calcium: 9.8 mg/dL (ref 8.4–10.5)
Creatinine, Ser: 1.1 mg/dL (ref 0.50–1.35)
GFR calc non Af Amer: 68 mL/min — ABNORMAL LOW (ref >90)
Glucose, Bld: 107 mg/dL — ABNORMAL HIGH (ref 70–99)
Sodium: 136 mEq/L (ref 135–145)

## 2013-06-08 NOTE — Progress Notes (Signed)
States at PST appt is due for injection in eye 3 days pre op for macular degeneration, asking if this is OK. Instructed him to clarify with surgeon.  Also states does take Metoprolol twice a day and cardiology is aware.

## 2013-06-08 NOTE — Progress Notes (Signed)
06/08/13 1440  OBSTRUCTIVE SLEEP APNEA  Have you ever been diagnosed with sleep apnea through a sleep study? No  Do you snore loudly (loud enough to be heard through closed doors)?  1  Do you often feel tired, fatigued, or sleepy during the daytime? 0  Has anyone observed you stop breathing during your sleep? 1  Do you have, or are you being treated for high blood pressure? 0  BMI more than 35 kg/m2? 0  Age over 68 years old? 1  Neck circumference greater than 40 cm/18 inches? 0  Gender: 1  Obstructive Sleep Apnea Score 4  Score 4 or greater  Results sent to PCP

## 2013-06-08 NOTE — Patient Instructions (Addendum)
20 Shane Walter  06/08/2013   Your procedure is scheduled on:  06/15/13 THURSDAY  Report to [facility] at  0630     AM.  Call this number if you have problems the morning of surgery: [phone]       Remember:   Do not eat food  Or drink :After Midnight.WEDNESDAY NIGHT   Take these medicines the morning of surgery with A SIP OF WATER: METOPROLOL   .  Contacts, dentures or partial plates can not be worn to surgery  Leave suitcase in the car. After surgery it may be brought to your room.  For patients admitted to the hospital, checkout time is 11:00 AM day of  discharge.             SPECIAL INSTRUCTIONS- SEE [place] PREPARING FOR SURGERY INSTRUCTION SHEET-     DO NOT WEAR JEWELRY, LOTIONS, POWDERS, OR PERFUMES.  WOMEN-- DO NOT SHAVE LEGS OR UNDERARMS FOR 12 HOURS BEFORE SHOWERS. MEN MAY SHAVE FACE.  Patients discharged the day of surgery will not be allowed to drive home. IF going home the day of surgery, you must have a driver and someone to stay with you for the first 24 hours  Name and phone number of your driver: wife  Shane Walter  [phone]                                                                       Please read over the following fact sheets that you were given: MRSA Information, Incentive Spirometry Sheet                                                                                  Shane Walter  PST [phone]  [phone]                 FAILURE TO FOLLOW THESE INSTRUCTIONS MAY RESULT IN  CANCELLATION   OF YOUR SURGERY                                                  Patient Signature _____________________________

## 2013-06-08 NOTE — Progress Notes (Signed)
Deniece Ree PA, EKG, Nuclear Stress Test, Clearance 6/14 EPIC

## 2013-06-09 ENCOUNTER — Encounter: Payer: Self-pay | Admitting: Internal Medicine

## 2013-06-09 DIAGNOSIS — G4733 Obstructive sleep apnea (adult) (pediatric): Secondary | ICD-10-CM | POA: Insufficient documentation

## 2013-06-15 ENCOUNTER — Ambulatory Visit (HOSPITAL_COMMUNITY): Payer: Commercial Managed Care - PPO | Admitting: Anesthesiology

## 2013-06-15 ENCOUNTER — Encounter (HOSPITAL_COMMUNITY): Payer: Self-pay | Admitting: Anesthesiology

## 2013-06-15 ENCOUNTER — Encounter (HOSPITAL_COMMUNITY)
Admission: RE | Disposition: A | Discharge status: Home / Self Care | Payer: Self-pay | Source: Ambulatory Visit | Attending: Specialist

## 2013-06-15 ENCOUNTER — Observation Stay (HOSPITAL_COMMUNITY)
Admission: RE | Admit: 2013-06-15 | Discharge: 2013-06-16 | Disposition: A | Discharge status: Home / Self Care | Payer: Commercial Managed Care - PPO | Source: Ambulatory Visit | Attending: Specialist | Admitting: Specialist

## 2013-06-15 ENCOUNTER — Encounter (HOSPITAL_COMMUNITY): Payer: Self-pay | Admitting: *Deleted

## 2013-06-15 DIAGNOSIS — E785 Hyperlipidemia, unspecified: Secondary | ICD-10-CM | POA: Insufficient documentation

## 2013-06-15 DIAGNOSIS — X58XXXA Exposure to other specified factors, initial encounter: Secondary | ICD-10-CM | POA: Insufficient documentation

## 2013-06-15 DIAGNOSIS — I498 Other specified cardiac arrhythmias: Secondary | ICD-10-CM | POA: Insufficient documentation

## 2013-06-15 DIAGNOSIS — M75121 Complete rotator cuff tear or rupture of right shoulder, not specified as traumatic: Secondary | ICD-10-CM

## 2013-06-15 DIAGNOSIS — Z7982 Long term (current) use of aspirin: Secondary | ICD-10-CM | POA: Insufficient documentation

## 2013-06-15 DIAGNOSIS — K219 Gastro-esophageal reflux disease without esophagitis: Secondary | ICD-10-CM | POA: Insufficient documentation

## 2013-06-15 DIAGNOSIS — Z951 Presence of aortocoronary bypass graft: Secondary | ICD-10-CM | POA: Insufficient documentation

## 2013-06-15 DIAGNOSIS — M7500 Adhesive capsulitis of unspecified shoulder: Secondary | ICD-10-CM | POA: Insufficient documentation

## 2013-06-15 DIAGNOSIS — M25819 Other specified joint disorders, unspecified shoulder: Secondary | ICD-10-CM | POA: Insufficient documentation

## 2013-06-15 DIAGNOSIS — I2510 Atherosclerotic heart disease of native coronary artery without angina pectoris: Secondary | ICD-10-CM | POA: Insufficient documentation

## 2013-06-15 DIAGNOSIS — S43429A Sprain of unspecified rotator cuff capsule, initial encounter: Principal | ICD-10-CM | POA: Insufficient documentation

## 2013-06-15 DIAGNOSIS — Z79899 Other long term (current) drug therapy: Secondary | ICD-10-CM | POA: Insufficient documentation

## 2013-06-15 HISTORY — PX: SHOULDER ARTHROSCOPY WITH OPEN ROTATOR CUFF REPAIR: SHX6092

## 2013-06-15 LAB — CREATININE, SERUM
Creatinine, Ser: 1 mg/dL (ref 0.50–1.35)
GFR calc Af Amer: 87 mL/min — ABNORMAL LOW (ref >90)
GFR calc non Af Amer: 75 mL/min — ABNORMAL LOW (ref >90)

## 2013-06-15 LAB — CBC
HCT: 39.1 % (ref 39.0–52.0)
Hemoglobin: 13.3 g/dL (ref 13.0–17.0)
RBC: 4.72 MIL/uL (ref 4.22–5.81)
WBC: 9.6 10*3/uL (ref 4.0–10.5)

## 2013-06-15 SURGERY — ARTHROSCOPY, SHOULDER WITH REPAIR, ROTATOR CUFF, OPEN
Anesthesia: General | Site: Shoulder | Laterality: Right | Wound class: Clean

## 2013-06-15 MED ORDER — MIDAZOLAM HCL 5 MG/5ML IJ SOLN
INTRAMUSCULAR | Status: DC | PRN
  Administered 2013-06-15: 2 mg via INTRAVENOUS

## 2013-06-15 MED ORDER — ONDANSETRON HCL 4 MG/2ML IJ SOLN: 4.0000 mg | Freq: Four times a day (QID) | INTRAMUSCULAR | Status: DC | PRN

## 2013-06-15 MED ORDER — ONDANSETRON HCL 4 MG PO TABS: 4.0000 mg | ORAL_TABLET | Freq: Four times a day (QID) | ORAL | Status: DC | PRN

## 2013-06-15 MED ORDER — EPHEDRINE SULFATE 50 MG/ML IJ SOLN
INTRAMUSCULAR | Status: DC | PRN
  Administered 2013-06-15: 5 mg via INTRAVENOUS

## 2013-06-15 MED ORDER — KETOROLAC TROMETHAMINE 15 MG/ML IJ SOLN
15.0000 mg | Freq: Four times a day (QID) | INTRAMUSCULAR | Status: DC
  Administered 2013-06-15 – 2013-06-16 (×4): 15 mg via INTRAVENOUS
  Filled 2013-06-15 (×8): qty 1

## 2013-06-15 MED ORDER — LOSARTAN POTASSIUM 50 MG PO TABS
100.0000 mg | ORAL_TABLET | Freq: Every day | ORAL | Status: DC
  Administered 2013-06-15 – 2013-06-16 (×2): 100 mg via ORAL
  Filled 2013-06-15 (×2): qty 2

## 2013-06-15 MED ORDER — ROCURONIUM BROMIDE 100 MG/10ML IV SOLN
INTRAVENOUS | Status: DC | PRN
  Administered 2013-06-15: 35 mg via INTRAVENOUS

## 2013-06-15 MED ORDER — MENTHOL 3 MG MT LOZG: 1.0000 | LOZENGE | OROMUCOSAL | Status: DC | PRN

## 2013-06-15 MED ORDER — PHENOL 1.4 % MT LIQD: 1.0000 | OROMUCOSAL | Status: DC | PRN

## 2013-06-15 MED ORDER — LOSARTAN POTASSIUM 50 MG PO TABS: 100.0000 mg | ORAL_TABLET | Freq: Every morning | ORAL | Status: DC

## 2013-06-15 MED ORDER — METHOCARBAMOL 500 MG PO TABS
500.0000 mg | ORAL_TABLET | Freq: Four times a day (QID) | ORAL | Status: DC | PRN
  Administered 2013-06-15 – 2013-06-16 (×2): 500 mg via ORAL
  Filled 2013-06-15 (×2): qty 1

## 2013-06-15 MED ORDER — ONDANSETRON HCL 4 MG/2ML IJ SOLN
INTRAMUSCULAR | Status: DC | PRN
  Administered 2013-06-15: 4 mg via INTRAVENOUS

## 2013-06-15 MED ORDER — BUPIVACAINE-EPINEPHRINE 0.5% -1:200000 IJ SOLN
INTRAMUSCULAR | Status: DC | PRN
  Administered 2013-06-15: 15 mL

## 2013-06-15 MED ORDER — EPINEPHRINE HCL 1 MG/ML IJ SOLN
INTRAMUSCULAR | Status: DC | PRN
  Administered 2013-06-15: 2 mg

## 2013-06-15 MED ORDER — KETOROLAC TROMETHAMINE 15 MG/ML IJ SOLN
INTRAMUSCULAR | Status: AC
  Administered 2013-06-15: 15 mg via INTRAVENOUS
  Filled 2013-06-15: qty 1

## 2013-06-15 MED ORDER — CEFAZOLIN SODIUM-DEXTROSE 2-3 GM-% IV SOLR
2.0000 g | Freq: Three times a day (TID) | INTRAVENOUS | Status: DC
  Administered 2013-06-15 – 2013-06-16 (×3): 2 g via INTRAVENOUS
  Filled 2013-06-15 (×6): qty 50

## 2013-06-15 MED ORDER — ACETAMINOPHEN 325 MG PO TABS: 650.0000 mg | ORAL_TABLET | Freq: Four times a day (QID) | ORAL | Status: DC | PRN

## 2013-06-15 MED ORDER — SODIUM CHLORIDE 0.45 % IV SOLN
INTRAVENOUS | Status: DC
  Administered 2013-06-15: 16:00:00 via INTRAVENOUS

## 2013-06-15 MED ORDER — METOPROLOL SUCCINATE ER 25 MG PO TB24
25.0000 mg | ORAL_TABLET | Freq: Two times a day (BID) | ORAL | Status: DC
  Administered 2013-06-16: 25 mg via ORAL
  Filled 2013-06-15 (×3): qty 1

## 2013-06-15 MED ORDER — METHOCARBAMOL 100 MG/ML IJ SOLN
500.0000 mg | Freq: Four times a day (QID) | INTRAVENOUS | Status: DC | PRN
  Administered 2013-06-15: 500 mg via INTRAVENOUS
  Filled 2013-06-15: qty 5

## 2013-06-15 MED ORDER — ACETAMINOPHEN 650 MG RE SUPP: 650.0000 mg | Freq: Four times a day (QID) | RECTAL | Status: DC | PRN

## 2013-06-15 MED ORDER — ENOXAPARIN SODIUM 40 MG/0.4ML ~~LOC~~ SOLN
40.0000 mg | SUBCUTANEOUS | Status: DC
  Administered 2013-06-15: 40 mg via SUBCUTANEOUS
  Filled 2013-06-15 (×2): qty 0.4

## 2013-06-15 MED ORDER — ACETAMINOPHEN 10 MG/ML IV SOLN
INTRAVENOUS | Status: AC
  Filled 2013-06-15: qty 100

## 2013-06-15 MED ORDER — PROMETHAZINE HCL 25 MG/ML IJ SOLN: 6.2500 mg | INTRAMUSCULAR | Status: DC | PRN

## 2013-06-15 MED ORDER — GLYCOPYRROLATE 0.2 MG/ML IJ SOLN
INTRAMUSCULAR | Status: DC | PRN
  Administered 2013-06-15: 0.4 mg via INTRAVENOUS

## 2013-06-15 MED ORDER — HYDROMORPHONE HCL PF 1 MG/ML IJ SOLN
0.5000 mg | INTRAMUSCULAR | Status: DC | PRN
  Administered 2013-06-15 (×3): 0.5 mg via INTRAVENOUS
  Filled 2013-06-15 (×3): qty 1

## 2013-06-15 MED ORDER — LIDOCAINE HCL (CARDIAC) 20 MG/ML IV SOLN
INTRAVENOUS | Status: DC | PRN
  Administered 2013-06-15: 60 mg via INTRAVENOUS

## 2013-06-15 MED ORDER — BUPIVACAINE-EPINEPHRINE (PF) 0.5% -1:200000 IJ SOLN
INTRAMUSCULAR | Status: AC
  Filled 2013-06-15: qty 10

## 2013-06-15 MED ORDER — NEOSTIGMINE METHYLSULFATE 1 MG/ML IJ SOLN
INTRAMUSCULAR | Status: DC | PRN
  Administered 2013-06-15: 3 mg via INTRAVENOUS

## 2013-06-15 MED ORDER — ACETAMINOPHEN 10 MG/ML IV SOLN
INTRAVENOUS | Status: DC | PRN
  Administered 2013-06-15: 1000 mg via INTRAVENOUS

## 2013-06-15 MED ORDER — CEFAZOLIN SODIUM-DEXTROSE 2-3 GM-% IV SOLR
INTRAVENOUS | Status: AC
  Filled 2013-06-15: qty 50

## 2013-06-15 MED ORDER — OXYCODONE-ACETAMINOPHEN 5-325 MG PO TABS
1.0000 | ORAL_TABLET | ORAL | Status: DC | PRN
  Administered 2013-06-15: 1 via ORAL
  Administered 2013-06-16 (×2): 2 via ORAL
  Filled 2013-06-15 (×2): qty 2
  Filled 2013-06-15: qty 1

## 2013-06-15 MED ORDER — OXYCODONE-ACETAMINOPHEN 5-325 MG PO TABS: 1.0000 | ORAL_TABLET | ORAL | Status: DC | PRN

## 2013-06-15 MED ORDER — DOCUSATE SODIUM 100 MG PO CAPS
100.0000 mg | ORAL_CAPSULE | Freq: Two times a day (BID) | ORAL | Status: DC
  Administered 2013-06-15 – 2013-06-16 (×2): 100 mg via ORAL

## 2013-06-15 MED ORDER — LACTATED RINGERS IV SOLN
INTRAVENOUS | Status: DC | PRN
  Administered 2013-06-15 (×2): via INTRAVENOUS

## 2013-06-15 MED ORDER — METOCLOPRAMIDE HCL 5 MG/ML IJ SOLN: 5.0000 mg | Freq: Three times a day (TID) | INTRAMUSCULAR | Status: DC | PRN

## 2013-06-15 MED ORDER — LACTATED RINGERS IV SOLN: INTRAVENOUS | Status: DC

## 2013-06-15 MED ORDER — FENTANYL CITRATE 0.05 MG/ML IJ SOLN
25.0000 ug | INTRAMUSCULAR | Status: DC | PRN
  Administered 2013-06-15: 50 ug via INTRAVENOUS
  Administered 2013-06-15: 25 ug via INTRAVENOUS

## 2013-06-15 MED ORDER — CEFAZOLIN SODIUM-DEXTROSE 2-3 GM-% IV SOLR
2.0000 g | INTRAVENOUS | Status: AC
  Administered 2013-06-15: 2 g via INTRAVENOUS

## 2013-06-15 MED ORDER — FENTANYL CITRATE 0.05 MG/ML IJ SOLN
INTRAMUSCULAR | Status: DC | PRN
  Administered 2013-06-15 (×2): 50 ug via INTRAVENOUS

## 2013-06-15 MED ORDER — MEPERIDINE HCL 50 MG/ML IJ SOLN: 6.2500 mg | INTRAMUSCULAR | Status: DC | PRN

## 2013-06-15 MED ORDER — EPINEPHRINE HCL 1 MG/ML IJ SOLN
INTRAMUSCULAR | Status: AC
  Filled 2013-06-15: qty 2

## 2013-06-15 MED ORDER — FENTANYL CITRATE 0.05 MG/ML IJ SOLN
INTRAMUSCULAR | Status: AC
  Filled 2013-06-15: qty 2

## 2013-06-15 MED ORDER — PROPOFOL 10 MG/ML IV BOLUS
INTRAVENOUS | Status: DC | PRN
  Administered 2013-06-15: 150 mg via INTRAVENOUS

## 2013-06-15 MED ORDER — METOCLOPRAMIDE HCL 10 MG PO TABS: 5.0000 mg | ORAL_TABLET | Freq: Three times a day (TID) | ORAL | Status: DC | PRN

## 2013-06-15 MED ORDER — VITAMIN A 10000 UNITS PO CAPS: 10000.0000 [IU] | ORAL_CAPSULE | Freq: Every day | ORAL | Status: DC

## 2013-06-15 MED ORDER — VITAMIN A 8000 UNITS PO CAPS
8000.0000 [IU] | ORAL_CAPSULE | Freq: Every day | ORAL | Status: DC
  Administered 2013-06-15 – 2013-06-16 (×2): 8000 [IU] via ORAL
  Filled 2013-06-15 (×2): qty 1

## 2013-06-15 MED ORDER — LACTATED RINGERS IR SOLN
Status: DC | PRN
  Administered 2013-06-15: 6000 mL

## 2013-06-15 SURGICAL SUPPLY — 67 items
ANCH SUT 2 CRKSRW FT 14X4.5 (Anchor) ×1 IMPLANT
ANCHOR PEEK CORKSCREW 4.5 (Anchor) ×1 IMPLANT
BLADE CUTTER GATOR 3.5 (BLADE) ×2 IMPLANT
BLADE SURG SZ11 CARB STEEL (BLADE) ×2 IMPLANT
BNDG COHESIVE 6X5 TAN STRL LF (GAUZE/BANDAGES/DRESSINGS) ×2 IMPLANT
BUR OVAL 4.0 (BURR) ×2 IMPLANT
CANNULA ACUFO 5X76 (CANNULA) ×2 IMPLANT
CHLORAPREP W/TINT 26ML (MISCELLANEOUS) ×1 IMPLANT
CLEANER TIP ELECTROSURG 2X2 (MISCELLANEOUS) IMPLANT
CLOTH 2% CHLOROHEXIDINE 3PK (PERSONAL CARE ITEMS) ×2 IMPLANT
CLOTH BEACON ORANGE TIMEOUT ST (SAFETY) ×2 IMPLANT
DECANTER SPIKE VIAL GLASS SM (MISCELLANEOUS) ×2 IMPLANT
DRAPE LG THREE QUARTER DISP (DRAPES) ×1 IMPLANT
DRAPE ORTHO SPLIT 77X108 STRL (DRAPES)
DRAPE POUCH INSTRU U-SHP 10X18 (DRAPES) IMPLANT
DRAPE SURG ORHT 6 SPLT 77X108 (DRAPES) IMPLANT
DRAPE U-SHAPE 47X51 STRL (DRAPES) ×2 IMPLANT
DRSG AQUACEL AG ADV 3.5X 4 (GAUZE/BANDAGES/DRESSINGS) ×2 IMPLANT
DRSG AQUACEL AG ADV 3.5X 6 (GAUZE/BANDAGES/DRESSINGS) ×1 IMPLANT
DRSG EMULSION OIL 3X3 NADH (GAUZE/BANDAGES/DRESSINGS) ×2 IMPLANT
DURAPREP 26ML APPLICATOR (WOUND CARE) ×1 IMPLANT
ELECT NDL TIP 2.8 STRL (NEEDLE) IMPLANT
ELECT NEEDLE TIP 2.8 STRL (NEEDLE) ×2 IMPLANT
FILTER STRAW (MISCELLANEOUS) ×1 IMPLANT
GLOVE BIOGEL PI IND STRL 7.5 (GLOVE) ×1 IMPLANT
GLOVE BIOGEL PI IND STRL 8 (GLOVE) ×1 IMPLANT
GLOVE BIOGEL PI INDICATOR 7.5 (GLOVE) ×1
GLOVE BIOGEL PI INDICATOR 8 (GLOVE) ×1
GLOVE SURG SS PI 7.5 STRL IVOR (GLOVE) ×2 IMPLANT
GLOVE SURG SS PI 8.0 STRL IVOR (GLOVE) ×4 IMPLANT
GOWN PREVENTION PLUS LG XLONG (DISPOSABLE) ×3 IMPLANT
GOWN PREVENTION PLUS XLARGE (GOWN DISPOSABLE) ×1 IMPLANT
GOWN STRL REIN XL XLG (GOWN DISPOSABLE) ×5 IMPLANT
KIT BASIN OR (CUSTOM PROCEDURE TRAY) ×2 IMPLANT
MANIFOLD NEPTUNE II (INSTRUMENTS) ×4 IMPLANT
NDL MA TROC 1/2 (NEEDLE) IMPLANT
NDL MA TROC 1/2 CIR (NEEDLE) IMPLANT
NDL SAFETY ECLIPSE 18X1.5 (NEEDLE) IMPLANT
NDL SCORPION MULTI FIRE (NEEDLE) IMPLANT
NDL SPNL 18GX3.5 QUINCKE PK (NEEDLE) ×1 IMPLANT
NEEDLE HYPO 18GX1.5 SHARP (NEEDLE) ×2
NEEDLE MA TROC 1/2 (NEEDLE) ×2 IMPLANT
NEEDLE MA TROC 1/2 CIR (NEEDLE) IMPLANT
NEEDLE SCORPION MULTI FIRE (NEEDLE) ×2 IMPLANT
NEEDLE SPNL 18GX3.5 QUINCKE PK (NEEDLE) ×2 IMPLANT
PACK SHOULDER CUSTOM OPM052 (CUSTOM PROCEDURE TRAY) ×2 IMPLANT
POSITIONER SURGICAL ARM (MISCELLANEOUS) ×2 IMPLANT
SET ARTHROSCOPY TUBING (MISCELLANEOUS) ×4
SET ARTHROSCOPY TUBING LN (MISCELLANEOUS) ×1 IMPLANT
SLING ARM IMMOBILIZER LRG (SOFTGOODS) ×2 IMPLANT
SLING ARM IMMOBILIZER MED (SOFTGOODS) ×1 IMPLANT
SPONGE LAP 4X18 X RAY DECT (DISPOSABLE) IMPLANT
STRAP CHIN BCCS-OSI (MISCELLANEOUS) ×2 IMPLANT
STRIP CLOSURE SKIN 1/2X4 (GAUZE/BANDAGES/DRESSINGS) ×1 IMPLANT
SUCTION FRAZIER 12FR DISP (SUCTIONS) ×1 IMPLANT
SUT BONE WAX W31G (SUTURE) IMPLANT
SUT ETHIBOND 0 (SUTURE) IMPLANT
SUT ETHIBOND 2 OS 4 DA (SUTURE) IMPLANT
SUT ETHILON 4 0 PS 2 18 (SUTURE) ×2 IMPLANT
SUT PROLENE 3 0 PS 2 (SUTURE) IMPLANT
SUT VIC AB 1-0 CT2 27 (SUTURE) IMPLANT
SUT VIC AB 2-0 CT2 27 (SUTURE) ×1 IMPLANT
SUT VICRYL 0 UR6 27IN ABS (SUTURE) IMPLANT
SUT VICRYL 0-0 OS 2 NEEDLE (SUTURE) ×2 IMPLANT
SYR 20CC LL (SYRINGE) ×1 IMPLANT
SYR 3ML LL SCALE MARK (SYRINGE) ×1 IMPLANT
TUBING CONNECTING 10 (TUBING) ×3 IMPLANT

## 2013-06-15 NOTE — H&P (View-Only) (Signed)
Shane Walter is an 67 y.o. male.   Chief Complaint: right shoulder pain HPI: The patient is a 67 year old male being followed for their right shoulder pain. They are 12.5 months out from when symptoms began. Symptoms reported today include: pain and pain with overhead motions, while the patient does not report symptoms of: popping, weakness or numbness. Refractory to home exercise program, subacromial steroid injection, NSAIDs, rest, and activity modification. He notes pain through ROM R shoulder. He has had to stop reaching behind him (IR) on the right due to pain and notes that ER and abduction are both painful. The HEP made his pain worse so he has stopped doing that. He is waking up at night from pain. Even walking, and swinging his arms, has become painful. Past Medical History  Diagnosis Date  . Varicose veins   . Diverticulosis 2011    Dr Marina Goodell  . Hyperlipidemia   . CAD (coronary artery disease)     a. 3-vessel CABG in 5/09 at [facility] after episode of unstable angina (L-LAD, RIMA-OM1, S-D1);  b.Echo (10/12) with EF 55-60%, restrictive diastolic function, mild MR.;  c.  Lexiscan Myoview 6/14:  Low risk, fixed inferolateral defect, no ischemia, EF 56% (of note ETT with ST depression but myoview normal)  . Gastritis     H. pylori +  . Colon polyp 2008     [facility]    Past Surgical History  Procedure Laterality Date  . Coronary artery bypass graft      04/2008- three vessels, [facility], Dr. Ty Hilts  . Colonoscopy w/ polypectomy  2008    [facility]  . Cardiac catheterization      NEG  . Cardiolite      MILD APICAL ISCHEMIA  . Colonoscopy with polypectomy      Dr Marina Goodell    Family History  Problem Relation Age of Onset  . Heart attack Neg Hx   . Stroke Neg Hx   . Diabetes Neg Hx   . Heart disease Brother     "1/2 brother";CABG  . Breast cancer      1/2 sister  . Colon cancer Neg Hx    Social History:  reports that he quit smoking about 37 years ago. He does not have any smokeless  tobacco history on file. He reports that  drinks alcohol. He reports that he does not use illicit drugs.  Allergies:  Allergies  Allergen Reactions  . Iohexol      Contrast allergy reported on prior CT report with diffuse rash      (Not in a hospital admission)  No results found for this or any previous visit (from the past 48 hour(s)). No results found.  Review of Systems  Constitutional: Negative.   HENT: Negative.   Eyes: Negative.   Respiratory: Negative.   Cardiovascular: Negative.   Gastrointestinal: Negative.   Genitourinary: Negative.   Musculoskeletal: Positive for joint pain.  Skin: Negative.   Neurological: Negative.   Endo/Heme/Allergies: Negative.   Psychiatric/Behavioral: Negative.     There were no vitals taken for this visit. Physical Exam  Constitutional: He is oriented to person, place, and time. He appears well-developed and well-nourished.  HENT:  Head: Normocephalic and atraumatic.  Eyes: Conjunctivae and EOM are normal. Pupils are equal, round, and reactive to light.  Neck: Normal range of motion. Neck supple.  Cardiovascular: Normal rate and regular rhythm.   Respiratory: Effort normal and breath sounds normal.  GI: Soft. Bowel sounds are normal.  Musculoskeletal:  Right Shoulder: Inspection and Palpation:Tenderness- anterior joint line tender to palpation. no tenderness to palpation of the [facility] joint, no tenderness to palpation of the clavicle and no tenderness to palpation of the subacromial space. Swelling- none. Effusion- none. Crepitus- No None. Muscle tone- no periarticular muscle atrophy noted. Sensation- intact to light touch. Skin:Color- no ecchymosis and no erythema. ROM: Internal Rotation:AROM- painful and within 5% of the contralateral extremity. External Rotation:AROM- within 10% of the contralateral extremity and painful. Flexion:AROM- full and painful. Glenohumeral Abduction:AROM- full and painful. Strength and  Tone:Biceps- 5/5. Deltoid- normal. Triceps- 5/5. Flexion- 5/5. Extension- 5/5. Abduction- 5/5. Internal Rotation- 5/5. External Rotation- 5/5. Rotator Cuff- normal. Instability- sulcus sign negative. Impingement- impingement sign positive and secondary impingement sign positive. Deformities/Malalignments/Discrepancies- no deformities noted. Special Testing- Speed's test negative.  Neurological: He is alert and oriented to person, place, and time. He has normal reflexes.  Skin: Skin is warm and dry.  Psychiatric: He has a normal mood and affect.    MRI R shoulder with supraspinatus and infraspinatus tendinosis; articular surface tear mid to anterior supraspinatus footprint with full thickness extension anteriorly and posteriorly (articular surface), and 2mm retraction of the far anterior bursal fibers. AC arthrosis and hypertrophy with undersurface spurring consistent with impingement.  prior xrays from 12/13 reviewed with type 3 acromion, undersurface spurring. Mild AC and GH DJD.  Assessment/Plan R shoulder impingement, RCT, adhesive capsulitis  R shoulder pain 1 year duration consistent with impingement, with RCT seen on MRI, beginning to develop early adhesive capsulitis. Symptoms refractory to HEP, NSAIDs, activity modification, and subacromial steroid injection. At this point, recommend proceeding with surgical intervention, a right shoulder arthroscopy, SAD, bursectomy, mini-open RCR, exam under anesthesia, possible manipulation under anesthesia. Discussed surgery itself as well as risks, complications, and alternatives including but not limited to DVT, PE, infx, bleeding, failure of procedure, need for secondary procedure, anesthesia risk, even death. Discussed expected outcome and post-op course. Anatomic models and illustrations/handouts used. All questions answered, he desires to proceed with surgery. Will obtain pre-op clearance from PCP Dr. Alwyn Ren. Continue with activity  modifications in the interim to avoid exacerbation. Discussed importance of long-term activity modification to prevent recurrence or exacerbation. His wife will be returning from [place] later this month so he plans to schedule surgery once she is here to have help at home. He will follow up 10-14 days post-op for suture removal. He will call with any questions or concerns in the interim.  I had a long discussion with the patient concerning the risks and benefits of a rotator cuff repair, including bleeding, infection, prolonged postoperative recovery, which may require 3 to 5 months until maximum medical improvement. Overight procedure with initiation of early passive range of motion within physical therapy. Avoid any active motion for the first six weeks. This is all in an effort to avoid recurrent tear of the rotator cuff and adhesive capsulitis. Return to work without use of the arm can be obtained following two weeks. However, driving will be a challenge. We also discussed the possibility of requiring implants including bone anchors,as well as an Allograft patch graft if a massive rotator cuff tear is encountered. Removal of any bones for spurs as well as bursitis will be performed during the procedure and also any associated anesthetic complications as well.  Andrez Grime M. for Dr Shelle Iron 06/06/2013, 8:42 AM

## 2013-06-15 NOTE — Preoperative (Signed)
Beta Blockers   Reason not to administer Beta Blockers:Not Applicable 

## 2013-06-15 NOTE — Brief Op Note (Signed)
06/15/2013  11:00 AM  PATIENT:  Jerilee Field  67 y.o. male  PRE-OPERATIVE DIAGNOSIS:  RIGHT SHOULDER IMPINGEMENT ROTATOR CUFF REPAIR TEAR ADHESIVE CAPSULITIS   POST-OPERATIVE DIAGNOSIS:  RIGHT SHOULDER IMPINGEMENT ROTATOR CUFF REPAIR TEAR ADHESIVE CAPSULITIS   PROCEDURE:  Procedure(s): RIGHT SHOULDER ARTHROSCOPY, MINI OPEN ROTATOR CUFF REPAIR, EVALUATION UNDER ANESTHESIA (Right)  SURGEON:  Surgeon(s) and Role:    * Javier Docker, MD - Primary  PHYSICIAN ASSISTANT:   ASSISTANTS: tech   ANESTHESIA:   general  EBL:     BLOOD ADMINISTERED:none  DRAINS: none   LOCAL MEDICATIONS USED:  MARCAINE     SPECIMEN:  No Specimen  DISPOSITION OF SPECIMEN:  N/A  COUNTS:  YES  TOURNIQUET:  * No tourniquets in log *  DICTATION: .Other Dictation: Dictation Number [id number]  PLAN OF CARE: Admit for overnight observation  PATIENT DISPOSITION:  PACU - hemodynamically stable.   Delay start of Pharmacological VTE agent (>24hrs) due to surgical blood loss or risk of bleeding: no

## 2013-06-15 NOTE — Anesthesia Preprocedure Evaluation (Addendum)
Anesthesia Evaluation  Patient identified by MRN, date of birth, ID band Patient awake    Reviewed: Allergy & Precautions, H&P , NPO status , Patient's Chart, lab work & pertinent test results  Airway Mallampati: II TM Distance: >3 FB Neck ROM: Full    Dental no notable dental hx.    Pulmonary neg pulmonary ROS,  breath sounds clear to auscultation  Pulmonary exam normal       Cardiovascular hypertension, Pt. on medications + CAD and + CABG (2009) Rhythm:Regular Rate:Normal     Neuro/Psych negative neurological ROS  negative psych ROS   GI/Hepatic negative GI ROS, Neg liver ROS,   Endo/Other  negative endocrine ROS  Renal/GU negative Renal ROS  negative genitourinary   Musculoskeletal negative musculoskeletal ROS (+)   Abdominal   Peds negative pediatric ROS (+)  Hematology negative hematology ROS (+)   Anesthesia Other Findings   Reproductive/Obstetrics negative OB ROS                           Anesthesia Physical Anesthesia Plan  ASA: II  Anesthesia Plan: General   Post-op Pain Management:    Induction: Intravenous  Airway Management Planned: Oral ETT  Additional Equipment:   Intra-op Plan:   Post-operative Plan: Extubation in OR  Informed Consent: I have reviewed the patients History and Physical, chart, labs and discussed the procedure including the risks, benefits and alternatives for the proposed anesthesia with the patient or authorized representative who has indicated his/her understanding and acceptance.   Dental advisory given  Plan Discussed with: CRNA  Anesthesia Plan Comments:         Anesthesia Quick Evaluation

## 2013-06-15 NOTE — Transfer of Care (Signed)
Immediate Anesthesia Transfer of Care Note  Patient: Shane Walter  Procedure(s) Performed: Procedure(s): RIGHT SHOULDER ARTHROSCOPY, MINI OPEN ROTATOR CUFF REPAIR, EVALUATION UNDER ANESTHESIA (Right)  Patient Location: PACU  Anesthesia Type:General  Level of Consciousness: awake, alert  and oriented  Airway & Oxygen Therapy: Patient Spontanous Breathing and Patient connected to face mask oxygen  Post-op Assessment: Report given to PACU RN and Post -op Vital signs reviewed and stable  Post vital signs: Reviewed and stable  Complications: No apparent anesthesia complications

## 2013-06-15 NOTE — Anesthesia Postprocedure Evaluation (Signed)
  Anesthesia Post-op Note  Patient: Shane Walter  Procedure(s) Performed: Procedure(s) (LRB): RIGHT SHOULDER ARTHROSCOPY, MINI OPEN ROTATOR CUFF REPAIR, EVALUATION UNDER ANESTHESIA (Right)  Patient Location: PACU  Anesthesia Type: General  Level of Consciousness: awake and alert   Airway and Oxygen Therapy: Patient Spontanous Breathing  Post-op Pain: mild  Post-op Assessment: Post-op Vital signs reviewed, Patient's Cardiovascular Status Stable, Respiratory Function Stable, Patent Airway and No signs of Nausea or vomiting  Last Vitals:  Filed Vitals:   06/15/13 1434  BP: 160/74  Pulse: 55  Temp: 36.5 C  Resp: 16    Post-op Vital Signs: stable   Complications: No apparent anesthesia complications

## 2013-06-15 NOTE — Interval H&P Note (Signed)
History and Physical Interval Note:  06/15/2013 6:43 AM  Shane Walter  has presented today for surgery, with the diagnosis of RIGHT SHOULDER IMPINGEMENT ROTATOR CUFF REPAIR TEAR ADHESIVE CAPSULITIS   The various methods of treatment have been discussed with the patient and family. After consideration of risks, benefits and other options for treatment, the patient has consented to  Procedure(s): RIGHT SHOULDER ARTHROSCOPY SUBACROMIAL DECOMPRESSION BURSECTOMY MINI OPEN ROTATOR CUFF REPAIR EVALUATION UNDER ANESTHESIA, POSSIBLE MANIPULATION UNDER ANESTHESIA   (Right) as a surgical intervention .  The patient's history has been reviewed, patient examined, no change in status, stable for surgery.  I have reviewed the patient's chart and labs.  Questions were answered to the patient's satisfaction.     Oliverio Cho C

## 2013-06-15 NOTE — Discharge Instructions (Signed)
Change dressing daily, keep wound clean and dry. °Use sling at times except when exercising or showering °Ok to shower in 72 hours °No driving for 4-6 weeks °No lifting for 6 weeks operative arm °Pendulum exercises as instructed. °Ok to move wrist,elbow, and hand. °See Dr. Gaige Sebo in 10-14 days. Take one aspirin per day with a meal if not on a blood thinner or allergic to aspirin. °

## 2013-06-16 ENCOUNTER — Encounter (HOSPITAL_COMMUNITY): Payer: Self-pay | Admitting: Specialist

## 2013-06-16 LAB — BASIC METABOLIC PANEL
BUN: 17 mg/dL (ref 6–23)
CO2: 26 mEq/L (ref 19–32)
Calcium: 8.9 mg/dL (ref 8.4–10.5)
Chloride: 101 mEq/L (ref 96–112)
Creatinine, Ser: 0.99 mg/dL (ref 0.50–1.35)
Glucose, Bld: 124 mg/dL — ABNORMAL HIGH (ref 70–99)
Potassium: 4 mEq/L (ref 3.5–5.1)

## 2013-06-16 NOTE — Op Note (Signed)
Shane Walter, Shane Walter NO.:  [id number]  MEDICAL RECORD NO.:  [id number]  LOCATION:  1604                         FACILITY:  [facility]  PHYSICIAN:  Jene Every, M.D.    DATE OF BIRTH:  02-Nov-1946  DATE OF PROCEDURE: DATE OF DISCHARGE:                              OPERATIVE REPORT   PREOPERATIVE DIAGNOSIS:  Impingement syndrome, rotator cuff tear of the right shoulder and adhesive capsulitis.  POSTOPERATIVE DIAGNOSIS:  Impingement syndrome, rotator cuff tear of the right shoulder and adhesive capsulitis.  PROCEDURES PERFORMED: 1. Exam under anesthesia followed by manipulation under anesthesia. 2. Right shoulder arthroscopy with subacromial decompression and     bursectomy. 3. Mini open rotator cuff repair of supraspinatus with a PEEK suture     anchor.  ANESTHESIA:  General.  SURGEON:  Jene Every, M.D.  ASSISTANT:  Nurse Shearin.  HISTORY:  A 67 year old with rotator cuff tear, impingement syndrome, refractory to conservative treatment indicated for repair.  Risks and benefits discussed including bleeding, infection, damage to neurovascular structures, DVT, PE, anesthetic complications, etc.  TECHNIQUE:  Placed in supine beach-chair position.  After induction of adequate general anesthesia and 2 g Kefzol, the right shoulder and upper extremity were prepped and draped in usual sterile fashion.  Surgical marker utilized on the acromion AC joint coracoid.  A standard posterolateral and anterolateral portals were utilized with incision through the skin only.  With the arm in 0-30 position, gentle traction applied.  We have advanced the arthroscopic camera in the subacromial space triangulating.  It was insufflated with 60 mmHg.  Noted was hypertrophic bursa.  We introduced a shaver and performed a full bursectomy.  Full-thickness tear of the anterior portion of the supraspinatus was noted in the rotator cuff.  I therefore converted to a mini open rotator  cuff repair.  I made a small 2 cm incision over the anterolateral aspect of the acromion, identified the raphe, divided in line with skin incision, subperiosteally elevated the attachment of the anterolateral aspect of the acromion preserving the deltoid attachment and released the CA ligament.  We used a 3 mm Kerrison to perform acromioplasty of the anterolateral aspect of the acromion and medially. The calcification of the coracoclavicular ligament was not impinging. Digitally lysed adhesions into the subacromial space.  Used a Charnley retractor and performed a full bursectomy.  Small full-thickness tear side-to-side 1 cm in length was noted in the rotator cuff with hyperemic tissue and fraying, debrided this area with a sharp blade and a Baer rongeur and then, a cortical bone.  I then placed a PEEK suture anchor after utilization of an awl inserting the anchor and had excellent resistance to pullout.  We then repaired it side-to-side with the suture leaflets.  Good surgical knot was utilized.  I then over sewed with 0 Vicryl interrupted figure-of-eight sutures.  Remainder of the cuff was unremarkable.  This was longitudinal and without tension.  Then, copiously irrigating the wound again, we repaired the raphe with 1 Vicryl suture over the top through the acromion, subcu tissue with 2, skin with Prolene subcuticular.  Steri-Strips utilized.  Sterile dressing applied.  Placed in a sling, extubated without difficulty, and transported  to the recovery in satisfactory condition.  The patient tolerated the procedure well.  No complications.     Jene Every, M.D.     Cordelia Pen  D:  06/15/2013  T:  06/16/2013  Job:  [id number]

## 2013-06-16 NOTE — Progress Notes (Signed)
Subjective: 1 Day Post-Op Procedure(s) (LRB): RIGHT SHOULDER ARTHROSCOPY, MINI OPEN ROTATOR CUFF REPAIR, EVALUATION UNDER ANESTHESIA (Right) Patient reports pain as 4 on 0-10 scale.    Objective: Vital signs in last 24 hours: Temp:  [97.3 F (36.3 C)-99.1 F (37.3 C)] 98.5 F (36.9 C) (06/27 0527) Pulse Rate:  [40-55] 51 (06/27 0527) Resp:  [11-16] 16 (06/27 0527) BP: (122-168)/(62-89) 128/69 mmHg (06/27 0527) SpO2:  [92 %-100 %] 92 % (06/27 0527) Weight:  [92.534 kg (204 lb)] 92.534 kg (204 lb) (06/26 1245)  Intake/Output from previous day: 06/26 0701 - 06/27 0700 In: 1970 [P.O.:420; I.V.:1550] Out: 1140 [Urine:1130; Blood:10] Intake/Output this shift: Total I/O In: 180 [P.O.:180] Out: 650 [Urine:650]   Recent Labs  06/15/13 1432  HGB 13.3    Recent Labs  06/15/13 1432  WBC 9.6  RBC 4.72  HCT 39.1  PLT 187    Recent Labs  06/15/13 1432 06/16/13 0534  NA  --  135  K  --  4.0  CL  --  101  CO2  --  26  BUN  --  17  CREATININE 1.00 0.99  GLUCOSE  --  124*  CALCIUM  --  8.9   No results found for this basename: LABPT, INR,  in the last 72 hours  Neurologically intact Intact pulses distally Dorsiflexion/Plantar flexion intact Incision: dressing C/D/I  Assessment/Plan: 1 Day Post-Op Procedure(s) (LRB): RIGHT SHOULDER ARTHROSCOPY, MINI OPEN ROTATOR CUFF REPAIR, EVALUATION UNDER ANESTHESIA (Right) Advance diet Discharge home with home health Instructions given  Gay Moncivais C 06/16/2013, 6:42 AM

## 2013-06-16 NOTE — Evaluation (Signed)
Occupational Therapy Evaluation Patient Details Name: Shane Walter MRN: [id number] DOB: 1946/11/27 Today's Date: 06/16/2013 Time: 4098-1191 and 7:56- 8:16 OT Time Calculation (min): 54 min  OT Assessment / Plan / Recommendation History of present illness  pt has a h/o cad,macular degeneration, CABG   Clinical Impression   This 67 year old man was admitted for R RCR.  All education was completed and pt will follow up with Dr Shelle Iron.    OT Assessment       Follow Up Recommendations   (pt will follow up with dr Shelle Iron)    Barriers to Discharge      Equipment Recommendations  None recommended by OT    Recommendations for Other Services    Frequency       Precautions / Restrictions Precautions Precautions: Shoulder Type of Shoulder Precautions: sling at all times x bathing and dressing Precaution Booklet Issued: Yes (comment) Restrictions Weight Bearing Restrictions: Yes RUE Weight Bearing: Non weight bearing   Pertinent Vitals/Pain Premedicated.  Pt sore posterior upper arm after adl/pendulums.     ADL  Lower Body Bathing: Moderate assistance Where Assessed - Lower Body Bathing: Unsupported sitting Upper Body Dressing: Maximal assistance Where Assessed - Upper Body Dressing: Unsupported standing Lower Body Dressing: Maximal assistance Where Assessed - Lower Body Dressing: Unsupported sit to stand Transfers/Ambulation Related to ADLs: pt independent with mobility:  bed and transfers ADL Comments: Worked through ADL.  Initially, I adjusted sling this am and pt had multiple questions about what was done.  Told he will have to ask Dr Shelle Iron more specifics about anchor used.  Reviewed protocol and pt/wife verbalize understanding.  Wife donned sling with min A and expressed comfort with this.   Pt needed cues with pendulums to keep exercise passive, arm following body.  Wife did an excellent job of cueing him.     OT Diagnosis:    OT Problem List:   OT Treatment Interventions:          Visit Information  Last OT Received On: 06/16/13       Prior Functioning     Home Living Family/patient expects to be discharged to:: Private residence Living Arrangements: Spouse/significant other Prior Function Level of Independence: Independent Communication Communication: No difficulties Dominant Hand: Right         Vision/Perception     Cognition  Cognition Arousal/Alertness: Awake/alert Behavior During Therapy: WFL for tasks assessed/performed Overall Cognitive Status: Within Functional Limits for tasks assessed    Extremity/Trunk Assessment Upper Extremity Assessment Upper Extremity Assessment: RUE deficits/detail (LUE wfls) RUE Deficits / Details: NT due to precautions     Mobility       Exercise     Balance     End of Session OT - End of Session Activity Tolerance: Patient tolerated treatment well Patient left: in bed;with call bell/phone within reach;with family/visitor present Nurse Communication:  (ready for IV to be reconnected)  GO Functional Assessment Tool Used: clinical observation Functional Limitation: Self care Self Care Current Status (Y7829): At least 60 percent but less than 80 percent impaired, limited or restricted Self Care Goal Status (F6213): At least 60 percent but less than 80 percent impaired, limited or restricted Self Care Discharge Status [phone]): At least 60 percent but less than 80 percent impaired, limited or restricted   [facility] 06/16/2013, 10:44 AM Marica Otter, OTR/L [phone] 06/16/2013

## 2013-06-16 NOTE — Discharge Summary (Signed)
Patient ID: Shane Walter MRN: [id number] DOB/AGE: 07-13-1946 67 y.o.  Admit date: 06/15/2013 Discharge date: 06/16/2013  Admission Diagnoses:  Principal Problem:   Complete tear of right rotator cuff   Discharge Diagnoses:  Same  Past Medical History  Diagnosis Date  . Varicose veins   . Diverticulosis 2011    Dr Marina Goodell  . Hyperlipidemia   . CAD (coronary artery disease)     a. 3-vessel CABG in 5/09 at [facility] after episode of unstable angina (L-LAD, RIMA-OM1, S-D1);  b.Echo (10/12) with EF 55-60%, restrictive diastolic function, mild MR.;  c.  Lexiscan Myoview 6/14:  Low risk, fixed inferolateral defect, no ischemia, EF 56% (of note ETT with ST depression but myoview normal)  . Gastritis     H. pylori +  . Colon polyp 2008     [facility]  . GERD (gastroesophageal reflux disease)   . Headache(784.0)     hx migraines  . Macular degeneration of right eye     Surgeries: Procedure(s): RIGHT SHOULDER ARTHROSCOPY, MINI OPEN ROTATOR CUFF REPAIR, EVALUATION UNDER ANESTHESIA on 06/15/2013   Consultants:    Discharged Condition: Improved  Hospital Course: Shane Walter is an 67 y.o. male who was admitted 06/15/2013 for operative treatment ofComplete tear of right rotator cuff. Patient has severe unremitting pain that affects sleep, daily activities, and work/hobbies. After pre-op clearance the patient was taken to the operating room on 06/15/2013 and underwent  Procedure(s): RIGHT SHOULDER ARTHROSCOPY, MINI OPEN ROTATOR CUFF REPAIR, EVALUATION UNDER ANESTHESIA.    Patient was given perioperative antibiotics: Anti-infectives   Start     Dose/Rate Route Frequency Ordered Stop   06/15/13 1800  ceFAZolin (ANCEF) IVPB 2 g/50 mL premix     2 g 100 mL/hr over 30 Minutes Intravenous Every 8 hours 06/15/13 1238     06/15/13 0645  ceFAZolin (ANCEF) IVPB 2 g/50 mL premix     2 g 100 mL/hr over 30 Minutes Intravenous On call to O.R. 06/15/13 4098 06/15/13 0931       Patient was given  sequential compression devices, early ambulation, and chemoprophylaxis to prevent DVT.  Patient benefited maximally from hospital stay and there were no complications.    Recent vital signs: Patient Vitals for the past 24 hrs:  BP Temp Temp src Pulse Resp SpO2 Height Weight  06/16/13 0527 128/69 mmHg 98.5 F (36.9 C) - 51 16 92 % - -  06/16/13 0200 122/75 mmHg 99.1 F (37.3 C) - 52 16 95 % - -  06/15/13 2109 155/89 mmHg 98.3 F (36.8 C) Oral 50 16 100 % - -  06/15/13 1810 150/70 mmHg 97.3 F (36.3 C) Oral 49 16 99 % - -  06/15/13 1528 158/79 mmHg 97.8 F (36.6 C) Oral 47 16 99 % - -  06/15/13 1434 160/74 mmHg 97.7 F (36.5 C) Oral 55 16 99 % - -  06/15/13 1328 163/76 mmHg 97.5 F (36.4 C) Oral 52 16 100 % - -  06/15/13 1245 168/76 mmHg 97.4 F (36.3 C) Oral 46 14 100 % 5\' 10"  (1.778 m) 92.534 kg (204 lb)  06/15/13 1240 168/76 mmHg 97.4 F (36.3 C) Oral 46 14 100 % 5\' 10"  (1.778 m) 92.534 kg (204 lb)  06/15/13 1230 168/76 mmHg 97.4 F (36.3 C) - 46 14 100 % - -  06/15/13 1215 149/65 mmHg 97.8 F (36.6 C) - 49 12 100 % - -  06/15/13 1200 149/62 mmHg - - 51 13 100 % - -  06/15/13 1145 150/69 mmHg - - 47 13 100 % - -  06/15/13 1130 153/76 mmHg - - 41 11 100 % - -  06/15/13 1115 145/67 mmHg - - 40 13 100 % - -  06/15/13 1105 139/68 mmHg 97.7 F (36.5 C) - 43 11 100 % - -     Recent laboratory studies:  Recent Labs  06/15/13 1432 06/16/13 0534  WBC 9.6  --   HGB 13.3  --   HCT 39.1  --   PLT 187  --   NA  --  135  K  --  4.0  CL  --  101  CO2  --  26  BUN  --  17  CREATININE 1.00 0.99  GLUCOSE  --  124*  CALCIUM  --  8.9     Discharge Medications:     Medication List    TAKE these medications       acetaminophen 500 MG tablet  Commonly known as:  TYLENOL  Take 500 mg by mouth every 6 (six) hours as needed for pain.     aspirin 81 MG tablet  Take 81 mg by mouth daily.     Fish Oil 1000 MG Caps  Take 1,000 mg by mouth daily.     losartan 100 MG tablet   Commonly known as:  COZAAR  Take 100 mg by mouth every morning.     metoprolol succinate 25 MG 24 hr tablet  Commonly known as:  TOPROL-XL  Take 25 mg by mouth 2 (two) times daily.     oxyCODONE-acetaminophen 5-325 MG per tablet  Commonly known as:  PERCOCET  Take 1 tablet by mouth every 4 (four) hours as needed for pain.     rosuvastatin 20 MG tablet  Commonly known as:  CRESTOR  Take 20 mg by mouth at bedtime.     vitamin A 16109 UNIT capsule  Take 10,000 Units by mouth daily.        Diagnostic Studies: Dg Chest 2 View  06/08/2013   *RADIOLOGY REPORT*  Clinical Data: Preop for rotator cuff repair  CHEST - 2 VIEW  Comparison: 09/21/2008  Findings: Cardiomediastinal silhouette is stable.  Status post median sternotomy.  No acute infiltrate or pleural effusion.  No pulmonary edema.  Mild degenerative changes thoracic spine.  IMPRESSION: No active disease.  No significant change.   Original Report Authenticated By: Natasha Mead, M.D.    Disposition: Final discharge disposition not confirmed      Discharge Orders   Future Orders Complete By Expires     Call MD / Call 911  As directed     Comments:      If you experience chest pain or shortness of breath, CALL 911 and be transported to the hospital emergency room.  If you develope a fever above 101 F, pus (white drainage) or increased drainage or redness at the wound, or calf pain, call your surgeon's office.    Constipation Prevention  As directed     Comments:      Drink plenty of fluids.  Prune juice may be helpful.  You may use a stool softener, such as Colace (over the counter) 100 mg twice a day.  Use MiraLax (over the counter) for constipation as needed.    Diet - low sodium heart healthy  As directed     Increase activity slowly as tolerated  As directed        Follow-up Information   Follow up  with Ival Pacer C, MD In 2 weeks.   Contact information:   [address] [place]  [address] [phone]        Signed: Javier Docker 06/16/2013, 6:45 AM

## 2013-06-16 NOTE — Progress Notes (Signed)
Patient's heart rate less than 60, Matt Babish PA called, received order to hold tonight's dose of toprol due to bradycardia.  Will continue to monitor.

## 2013-07-26 ENCOUNTER — Other Ambulatory Visit: Payer: Self-pay

## 2013-07-28 ENCOUNTER — Other Ambulatory Visit: Payer: Self-pay | Admitting: Internal Medicine

## 2013-10-26 ENCOUNTER — Other Ambulatory Visit: Payer: Self-pay

## 2013-12-02 ENCOUNTER — Other Ambulatory Visit: Payer: Self-pay | Admitting: Internal Medicine

## 2014-01-19 ENCOUNTER — Other Ambulatory Visit: Payer: Self-pay | Admitting: Internal Medicine

## 2014-01-22 NOTE — Telephone Encounter (Signed)
Crestor refilled per protocol. JG//CMA

## 2014-02-08 ENCOUNTER — Ambulatory Visit (INDEPENDENT_AMBULATORY_CARE_PROVIDER_SITE_OTHER)
Admission: RE | Admit: 2014-02-08 | Discharge: 2014-02-08 | Disposition: A | Discharge status: Home / Self Care | Payer: Commercial Managed Care - PPO | Source: Ambulatory Visit | Attending: Internal Medicine | Admitting: Internal Medicine

## 2014-02-08 ENCOUNTER — Other Ambulatory Visit (INDEPENDENT_AMBULATORY_CARE_PROVIDER_SITE_OTHER): Payer: Commercial Managed Care - PPO

## 2014-02-08 ENCOUNTER — Encounter: Payer: Self-pay | Admitting: Internal Medicine

## 2014-02-08 ENCOUNTER — Ambulatory Visit (INDEPENDENT_AMBULATORY_CARE_PROVIDER_SITE_OTHER): Payer: Commercial Managed Care - PPO | Admitting: Internal Medicine

## 2014-02-08 VITALS — BP 138/60 | HR 59 | Temp 97.6°F | Wt 207.8 lb

## 2014-02-08 DIAGNOSIS — Z23 Encounter for immunization: Secondary | ICD-10-CM

## 2014-02-08 DIAGNOSIS — N529 Male erectile dysfunction, unspecified: Secondary | ICD-10-CM

## 2014-02-08 DIAGNOSIS — R61 Generalized hyperhidrosis: Secondary | ICD-10-CM

## 2014-02-08 DIAGNOSIS — R1012 Left upper quadrant pain: Secondary | ICD-10-CM

## 2014-02-08 LAB — CBC WITH DIFFERENTIAL/PLATELET
BASOS ABS: 0 10*3/uL (ref 0.0–0.1)
Basophils Relative: 0.3 % (ref 0.0–3.0)
Eosinophils Absolute: 0.2 10*3/uL (ref 0.0–0.7)
Eosinophils Relative: 2.8 % (ref 0.0–5.0)
HEMATOCRIT: 42 % (ref 39.0–52.0)
HEMOGLOBIN: 13.8 g/dL (ref 13.0–17.0)
LYMPHS ABS: 2.6 10*3/uL (ref 0.7–4.0)
Lymphocytes Relative: 31.8 % (ref 12.0–46.0)
MCHC: 33 g/dL (ref 30.0–36.0)
MCV: 87.6 fl (ref 78.0–100.0)
MONO ABS: 0.6 10*3/uL (ref 0.1–1.0)
MONOS PCT: 6.9 % (ref 3.0–12.0)
NEUTROS ABS: 4.7 10*3/uL (ref 1.4–7.7)
Neutrophils Relative %: 58.2 % (ref 43.0–77.0)
Platelets: 216 10*3/uL (ref 150.0–400.0)
RBC: 4.8 Mil/uL (ref 4.22–5.81)
RDW: 13.2 % (ref 11.5–14.6)
WBC: 8.1 10*3/uL (ref 4.5–10.5)

## 2014-02-08 LAB — URINALYSIS, ROUTINE W REFLEX MICROSCOPIC
BILIRUBIN URINE: NEGATIVE
KETONES UR: NEGATIVE
LEUKOCYTES UA: NEGATIVE
Nitrite: NEGATIVE
PH: 7 (ref 5.0–8.0)
Specific Gravity, Urine: 1.01 (ref 1.000–1.030)
TOTAL PROTEIN, URINE-UPE24: NEGATIVE
Urine Glucose: NEGATIVE
Urobilinogen, UA: 0.2 (ref 0.0–1.0)

## 2014-02-08 MED ORDER — HYOSCYAMINE SULFATE 0.125 MG SL SUBL: 0.1250 mg | SUBLINGUAL_TABLET | Freq: Four times a day (QID) | SUBLINGUAL | Status: AC | PRN

## 2014-02-08 MED ORDER — SILDENAFIL CITRATE 100 MG PO TABS: ORAL_TABLET | ORAL | Status: AC

## 2014-02-08 NOTE — Progress Notes (Signed)
Pre visit review using our clinic review tool, if applicable. No additional management support is needed unless otherwise documented below in the visit note. 

## 2014-02-08 NOTE — Patient Instructions (Signed)
Your next office appointment will be determined based upon review of your pending labs. Those instructions will be transmitted to you through My Chart  Please complete and return stool cards; these will determine whether there is any gastrointestinal bleeding risk.

## 2014-02-08 NOTE — Progress Notes (Signed)
   Subjective:    Patient ID: Shane Walter, male    DOB: Aug 02, 1946, 68 y.o.   MRN: [id number]  HPI   He has had intermittent left upper quadrant abdominal pain over  the last 4 months. It has been stable in nature. It is described as dull and lasting 30-120 minutes. It will radiate superiorly into the thorax.  He has found no definite exacerbating or intensifying factors. He does believe that the ingestion of ice water and lying in the left lateral decubitus position are of benefit.    He does have some night sweats.  His last colonoscopy was in 2008; he did have a polyp. He has had an upper endoscopy. He has a past history of H. pylori gastritis.      Review of Systems He specifically denies dysphagia, significant dyspepsia, anorexia, hematemesis, weight change, fever or chills.  He has no melena, rectal bleeding, or pencil thin stools. There's been no dysuria, pyuria, or hematuria.  He has had no back pain which radiates anteriorly  There has been no change in the color, temperature of skin in this area.  He denies any abnormal bruising or bleeding all reviewed he did have some blood from the left ear after he scratched it  Yesterday in the cold he noted some itching across the abdomen; this was not localized to the left upper quadrant.  He has no cough, sputum, shortness of breath.  At the end of the exam he mentioned significant erectile dysfunction. He denies taking nitroglycerin     Objective:   Physical Exam General appearance is one of good health and nourishment w/o distress.  Eyes: No conjunctival inflammation or scleral icterus is present.  Canals clear; TMs normal . No blood  Oral exam: Dental hygiene is good; lips and gums are healthy appearing.There is no oropharyngeal erythema or exudate noted.   Heart:  Normal rate and regular rhythm. S1 and S2 normal without gallop, murmur, click, rub or other extra sounds    Thyroid normal  Lungs:Chest clear to  auscultation; no wheezes, rhonchi,rales ,or rubs present.No increased work of breathing.   Abdomen: bowel sounds normal, soft and non-tender without masses, organomegaly or hernias noted.  No guarding or rebound . No tenderness over the flanks to percussion  Musculoskeletal: Able to lie flat and sit up without help. Negative straight leg raising bilaterally. Gait normal  Skin:Warm & dry.  Intact without suspicious lesions or rashes ; no jaundice or tenting  Lymphatic: No lymphadenopathy is noted about the head, neck, axilla              Assessment & Plan:  #1 LUQ pain  #2 subjective bleeding from otic canal. No bleeding visible  #3 night sweats without associated fever, chills, weight loss  #4 ED  Plan: CXR;CBC and differential ,urine;and stool cards. If symptoms persist or progress; repeat GI consultation is indicated. Free sample of 3 Viagra 100 mg one half to one daily as needed. He was informed that this cannot be taken with nitroglycerin.

## 2014-02-20 ENCOUNTER — Other Ambulatory Visit (INDEPENDENT_AMBULATORY_CARE_PROVIDER_SITE_OTHER): Payer: Commercial Managed Care - PPO

## 2014-02-20 ENCOUNTER — Other Ambulatory Visit: Payer: Self-pay | Admitting: *Deleted

## 2014-02-20 DIAGNOSIS — R1012 Left upper quadrant pain: Secondary | ICD-10-CM

## 2014-02-20 LAB — HEMOCCULT SLIDES (X 3 CARDS)
Fecal Occult Blood: NEGATIVE
OCCULT 1: NEGATIVE
OCCULT 2: NEGATIVE
OCCULT 3: NEGATIVE
OCCULT 4: NEGATIVE
OCCULT 5: NEGATIVE

## 2014-03-03 ENCOUNTER — Encounter: Payer: Self-pay | Admitting: Internal Medicine

## 2014-03-12 ENCOUNTER — Other Ambulatory Visit: Payer: Self-pay | Admitting: *Deleted

## 2014-03-12 MED ORDER — METOPROLOL SUCCINATE ER 25 MG PO TB24: ORAL_TABLET | ORAL | Status: DC

## 2014-03-12 NOTE — Telephone Encounter (Signed)
Rx sent to the pharmacy by e-script.//AB/CMA 

## 2014-05-15 ENCOUNTER — Encounter: Payer: Self-pay | Admitting: Internal Medicine

## 2014-05-15 ENCOUNTER — Ambulatory Visit (INDEPENDENT_AMBULATORY_CARE_PROVIDER_SITE_OTHER): Payer: Commercial Managed Care - PPO | Admitting: Internal Medicine

## 2014-05-15 ENCOUNTER — Other Ambulatory Visit (INDEPENDENT_AMBULATORY_CARE_PROVIDER_SITE_OTHER): Payer: Commercial Managed Care - PPO

## 2014-05-15 VITALS — BP 134/70 | HR 50 | Temp 98.1°F | Ht 69.75 in | Wt 204.6 lb

## 2014-05-15 DIAGNOSIS — D126 Benign neoplasm of colon, unspecified: Secondary | ICD-10-CM

## 2014-05-15 DIAGNOSIS — R1012 Left upper quadrant pain: Secondary | ICD-10-CM

## 2014-05-15 DIAGNOSIS — Z0000 Encounter for general adult medical examination without abnormal findings: Secondary | ICD-10-CM

## 2014-05-15 DIAGNOSIS — E785 Hyperlipidemia, unspecified: Secondary | ICD-10-CM

## 2014-05-15 DIAGNOSIS — I2510 Atherosclerotic heart disease of native coronary artery without angina pectoris: Secondary | ICD-10-CM

## 2014-05-15 DIAGNOSIS — I10 Essential (primary) hypertension: Secondary | ICD-10-CM

## 2014-05-15 DIAGNOSIS — G4730 Sleep apnea, unspecified: Secondary | ICD-10-CM

## 2014-05-15 DIAGNOSIS — R7309 Other abnormal glucose: Secondary | ICD-10-CM

## 2014-05-15 DIAGNOSIS — G4733 Obstructive sleep apnea (adult) (pediatric): Secondary | ICD-10-CM | POA: Insufficient documentation

## 2014-05-15 LAB — CBC WITH DIFFERENTIAL/PLATELET
Basophils Absolute: 0 10*3/uL (ref 0.0–0.1)
Basophils Relative: 0.5 % (ref 0.0–3.0)
EOS PCT: 2.3 % (ref 0.0–5.0)
Eosinophils Absolute: 0.2 10*3/uL (ref 0.0–0.7)
HCT: 41.8 % (ref 39.0–52.0)
Hemoglobin: 14.2 g/dL (ref 13.0–17.0)
LYMPHS PCT: 32.9 % (ref 12.0–46.0)
Lymphs Abs: 2.7 10*3/uL (ref 0.7–4.0)
MCHC: 33.9 g/dL (ref 30.0–36.0)
MCV: 85.7 fl (ref 78.0–100.0)
MONOS PCT: 8.4 % (ref 3.0–12.0)
Monocytes Absolute: 0.7 10*3/uL (ref 0.1–1.0)
NEUTROS PCT: 55.9 % (ref 43.0–77.0)
Neutro Abs: 4.6 10*3/uL (ref 1.4–7.7)
PLATELETS: 215 10*3/uL (ref 150.0–400.0)
RBC: 4.87 Mil/uL (ref 4.22–5.81)
RDW: 13.1 % (ref 11.5–15.5)
WBC: 8.2 10*3/uL (ref 4.0–10.5)

## 2014-05-15 LAB — LIPID PANEL
CHOL/HDL RATIO: 4
Cholesterol: 148 mg/dL (ref 0–200)
HDL: 41.7 mg/dL (ref >39.00)
LDL CALC: 75 mg/dL (ref 0–99)
TRIGLYCERIDES: 157 mg/dL — AB (ref 0.0–149.0)
VLDL: 31.4 mg/dL (ref 0.0–40.0)

## 2014-05-15 LAB — HEPATIC FUNCTION PANEL
ALK PHOS: 36 U/L — AB (ref 39–117)
ALT: 27 U/L (ref 0–53)
AST: 22 U/L (ref 0–37)
Albumin: 4 g/dL (ref 3.5–5.2)
BILIRUBIN DIRECT: 0.1 mg/dL (ref 0.0–0.3)
BILIRUBIN TOTAL: 0.9 mg/dL (ref 0.2–1.2)
Total Protein: 7.7 g/dL (ref 6.0–8.3)

## 2014-05-15 LAB — BASIC METABOLIC PANEL
BUN: 16 mg/dL (ref 6–23)
CO2: 27 mEq/L (ref 19–32)
CREATININE: 1.3 mg/dL (ref 0.4–1.5)
Calcium: 9.6 mg/dL (ref 8.4–10.5)
Chloride: 105 mEq/L (ref 96–112)
GFR: 60.88 mL/min (ref >60.00)
Glucose, Bld: 103 mg/dL — ABNORMAL HIGH (ref 70–99)
Potassium: 4.9 mEq/L (ref 3.5–5.1)
Sodium: 139 mEq/L (ref 135–145)

## 2014-05-15 LAB — TSH: TSH: 1.55 u[IU]/mL (ref 0.35–4.50)

## 2014-05-15 NOTE — Assessment & Plan Note (Signed)
Blood pressure goals reviewed. BMET 

## 2014-05-15 NOTE — Patient Instructions (Signed)
Your next office appointment will be determined based upon review of your pending labs. Those instructions will be transmitted to you through My Chart   Followup as needed for your acute issue. Please report any significant change in your symptoms. Minimal Blood Pressure Goal= AVERAGE < 140/90;  Ideal is an AVERAGE < 135/85. This AVERAGE should be calculated from @ least 5-7 BP readings taken @ different times of day on different days of week. You should not respond to isolated BP readings , but rather the AVERAGE for that week .Please bring your  blood pressure cuff to office visits to verify that it is reliable.It  can also be checked against the blood pressure device at the pharmacy. Finger or wrist cuffs are not dependable; an arm cuff is. The Cardiology & Sleep Medicine  referrals will be scheduled and you'll be notified of the time. Please take a probiotic , Florastor OR Align, every day to PREVENT abdominal pain. This will replace the normal bacteria which  are necessary for formation of normal stool and processing of food.

## 2014-05-15 NOTE — Assessment & Plan Note (Signed)
Annual Cardiology F/U

## 2014-05-15 NOTE — Progress Notes (Signed)
Subjective:    Patient ID: Shane Walter, male    DOB: 08/09/1946, 68 y.o.   MRN: [id number]  HPI  [facility] Medicare Wellness Visit: Psychosocial and medical history were reviewed as required by Medicare (history related to abuse, antisocial behavior , firearm risk). Social history: Caffeine:2 cups coffee& 3 tea per day , Alcohol: 1/day , Tobacco BWL:SLHT 1977 Exercise:see below Personal safety/fall risk: no Limitations of activities of daily living:no Seatbelt/ smoke alarm use:yes Healthcare Power of Attorney/Living Will status: needs completed Ophthalmologic exam status:UTD Hearing evaluation status:not UTD Orientation: Oriented X 3 Memory and recall: good Math testing: good Depression/anxiety assessment: no Foreign travel history:last 2012 [place] Immunization status for influenza/pneumonia/ shingles /tetanus: Shingles not taken Transfusion history:no Preventive health care maintenance status: Colonoscopy/BMD/mammogram/Pap as per protocol/standard care: UTD Dental care: every 4 mos Chart reviewed and updated. Active issues reviewed and addressed as documented below.  A heart healthy diet is followed; exercise encompasses 45-60 minutes 3-4 times per week as treadmill , weights& stretching without symptoms.  Family history is neg for premature coronary disease. With CAD  LDL goal is less than 70 . There is medication compliance with the statin.  Low dose ASA taken BP @ home  134-147/71-79    Review of Systems Specifically denied are  chest pain, palpitations, dyspnea, or claudication.  Significant persistent abdominal symptoms, memory deficit, or myalgias not present.  Intermittent (weekly) LUQ pain which has been evaluated. Levsin SL helps. Unexplained weight loss, significant dyspepsia, dysphagia, melena, rectal bleeding, or persistently small caliber stools are denied.Tubular adenoma 06/2011.     Objective:   Physical Exam Gen.: Healthy and well-nourished in appearance.  Alert, appropriate and cooperative throughout exam. Appears younger than stated age  Head: Normocephalic without obvious abnormalities;  no alopecia  Eyes: No corneal or conjunctival inflammation noted. Pupils equal round reactive to light and accommodation. Extraocular motion intact.  Ears: External  ear exam reveals no significant lesions or deformities. Canals clear .TMs normal. Hearing is grossly normal bilaterally. Nose: External nasal exam reveals no deformity or inflammation. Nasal mucosa are pink and moist. No lesions or exudates noted.   Mouth: Oral mucosa and oropharynx reveal no lesions or exudates. Teeth in good repair. Neck: No deformities, masses, or tenderness noted. Range of motion & Thyroid normal. Lungs: Normal respiratory effort; chest expands symmetrically. Lungs are clear to auscultation without rales, wheezes, or increased work of breathing. Heart: Normal rate and rhythm. Normal S1 and S2. No gallop, click, or rub. Slow S 4 w/o  murmur. Abdomen: Bowel sounds normal; abdomen soft and nontender. No masses, organomegaly or hernias noted. Genitalia: deferred                                Musculoskeletal/extremities: No deformity or scoliosis noted of  the thoracic or lumbar spine.  No clubbing, cyanosis, edema, or significant extremity  deformity noted. Range of motion normal .Tone & strength normal. Hand joints normal  Fingernail health good. Slight crepitus knees Able to lie down & sit up w/o help. Negative SLR bilaterally Vascular: Carotid, radial artery, dorsalis pedis and  posterior tibial pulses are full and equal. No bruits present. Neurologic: Alert and oriented x3. Deep tendon reflexes symmetrical and normal.  Gait normal     Skin: Intact without suspicious lesions or rashes. Lymph: No cervical, axillary lymphadenopathy present. Psych: Mood and affect are normal. Normally interactive  Assessment & Plan:  #1 Medicare Wellness Exam; criteria met ; data entered #2 Problem List/Diagnoses reviewed Plan:  Assessments made/ Orders entered See Current Assessment & Plan in Problem List under specific DiagnosisThe labs will be reviewed and risks and options assessed. Written recommendations will be provided by mail or directly through My Chart.Further evaluation or change in medical therapy will be directed by those results.

## 2014-05-15 NOTE — Assessment & Plan Note (Signed)
A1c Urine microalbumin 

## 2014-05-15 NOTE — Assessment & Plan Note (Signed)
Sleep Medicine referral 

## 2014-05-15 NOTE — Progress Notes (Signed)
Pre visit review using our clinic review tool, if applicable. No additional management support is needed unless otherwise documented below in the visit note. 

## 2014-05-15 NOTE — Assessment & Plan Note (Signed)
CBC & dif 

## 2014-05-15 NOTE — Assessment & Plan Note (Signed)
05/15/14 probiotic trial

## 2014-05-15 NOTE — Assessment & Plan Note (Signed)
Lipids, LFTs, TSH  

## 2014-05-16 ENCOUNTER — Telehealth: Payer: Self-pay | Admitting: Internal Medicine

## 2014-05-16 NOTE — Telephone Encounter (Signed)
Relevant patient education assigned to patient using Emmi. ° °

## 2014-05-21 ENCOUNTER — Telehealth: Payer: Self-pay

## 2014-05-21 ENCOUNTER — Ambulatory Visit: Payer: Commercial Managed Care - PPO

## 2014-05-21 DIAGNOSIS — R7309 Other abnormal glucose: Secondary | ICD-10-CM

## 2014-05-21 LAB — HEMOGLOBIN A1C: HEMOGLOBIN A1C: 6.3 % (ref 4.6–6.5)

## 2014-05-21 NOTE — Telephone Encounter (Signed)
Request has been faxed.

## 2014-05-21 NOTE — Telephone Encounter (Signed)
Message copied by Shelly Coss on Mon May 21, 2014  8:50 AM ------      Message from: Hendricks Limes      Created: Sat May 19, 2014  9:30 AM       Please add A1c (790.29)       ------

## 2014-06-18 ENCOUNTER — Ambulatory Visit (INDEPENDENT_AMBULATORY_CARE_PROVIDER_SITE_OTHER): Payer: Commercial Managed Care - PPO | Admitting: Pulmonary Disease

## 2014-06-18 ENCOUNTER — Encounter: Payer: Self-pay | Admitting: Pulmonary Disease

## 2014-06-18 VITALS — BP 128/88 | HR 50 | Ht 70.5 in | Wt 205.4 lb

## 2014-06-18 DIAGNOSIS — R0683 Snoring: Secondary | ICD-10-CM

## 2014-06-18 DIAGNOSIS — I2510 Atherosclerotic heart disease of native coronary artery without angina pectoris: Secondary | ICD-10-CM

## 2014-06-18 DIAGNOSIS — R0989 Other specified symptoms and signs involving the circulatory and respiratory systems: Secondary | ICD-10-CM

## 2014-06-18 DIAGNOSIS — R0609 Other forms of dyspnea: Secondary | ICD-10-CM

## 2014-06-18 DIAGNOSIS — G4730 Sleep apnea, unspecified: Secondary | ICD-10-CM

## 2014-06-18 NOTE — Patient Instructions (Signed)
Home sleep study 

## 2014-06-18 NOTE — Assessment & Plan Note (Signed)
Given  narrow pharyngeal exam, witnessed apneas & loud snoring, obstructive sleep apnea is very likely & an overnight polysomnogram will be scheduled as a home study. The pathophysiology of obstructive sleep apnea , it's cardiovascular consequences & modes of treatment including CPAP were discused with the patient in detail & they evidenced understanding.  Since he does not have significant comorbidities, sleep study can be performed  at home. If this ends up being positive, we will proceed with a CPAP titration study.

## 2014-06-18 NOTE — Progress Notes (Signed)
Subjective:    Patient ID: Shane Walter, male    DOB: 05/09/1946, 67 y.o.   MRN: [id number]  HPI  68 year old [place], Freight forwarder at [employer], presents for evaluation of sleep disordered breathing and loud snoring. Epworth sleepiness score is 3/24. His wife has reported loud snoring for many years. This is worse on his back and wakes him up from sleep. She has also witnessed apneas. His preop screening for sleep disordered breathing was positive. Bedtime is around 11:30 PM, sleep latency is minimal, he sleeps on his side with one pillow, reports 2-3 nocturnal awakenings for bathroom visits and is out of bed at 6:30 AM feeling rested but occasional dryness of mouth without headaches, He underwent CABG in 2009. He smoked up to 2 packs per day but quit in 76, about 20-pack-years.  Past Medical History  Diagnosis Date  . Varicose veins   . Diverticulosis 2011    Dr Henrene Pastor  . Hyperlipidemia   . CAD (coronary artery disease)     a. 3-vessel CABG in 5/09 at [facility] after episode of unstable angina (L-LAD, RIMA-OM1, S-D1);  b.Echo (10/12) with EF 55-60%, restrictive diastolic function, mild MR.;  c.  Lexiscan Myoview 6/14:  Low risk, fixed inferolateral defect, no ischemia, EF 56% (of note ETT with ST depression but myoview normal)  . Gastritis     H. pylori +  . Colon polyp 2008     [facility]  . GERD (gastroesophageal reflux disease)   . Headache(784.0)     hx migraines  . Macular degeneration of right eye     Past Surgical History  Procedure Laterality Date  . Coronary artery bypass graft      04/2008- three vessels, [facility], Dr. Lunette Stands  . Colonoscopy w/ polypectomy  2008    [facility]  . Cardiac catheterization      NEG  . Cardiolite      MILD APICAL ISCHEMIA  . Colonoscopy with polypectomy      Dr Henrene Pastor  . Shoulder arthroscopy with open rotator cuff repair Right 06/15/2013    Procedure: RIGHT SHOULDER ARTHROSCOPY, MINI OPEN ROTATOR CUFF REPAIR, EVALUATION UNDER ANESTHESIA;   Surgeon: Johnn Hai, MD;  Location: [facility];  Service: Orthopedics;  Laterality: Right;    Allergies  Allergen Reactions  . Iohexol      Contrast allergy reported on prior CT report with diffuse rash     History   Social History  . Marital Status: Married    Spouse Name: N/A    Number of Children: N/A  . Years of Education: N/A   Occupational History  . Not on file.   Social History Main Topics  . Smoking status: Former Smoker -- 2.00 packs/day for 17 years    Types: Cigarettes    Quit date: 12/22/1975  . Smokeless tobacco: Never Used     Comment: smoked 1962-1977, up to 2 ppd  . Alcohol Use: 0.0 oz/week     Comment:   1 glass of wine / night  . Drug Use: No  . Sexual Activity: Not on file   Other Topics Concern  . Not on file   Social History Narrative   MARRIED   REGULAR EXERCISE--YES.Marland KitchenCVE 4-5 x/WK X 45MINS             Family History  Problem Relation Age of Onset  . Heart attack Neg Hx   . Stroke Neg Hx   . Diabetes Neg Hx   . Heart disease  Brother     "1/2 brother";CABG  . Breast cancer      1/2 sister  . Colon cancer Neg Hx      Review of Systems  Respiratory: Positive for shortness of breath.   Gastrointestinal: Positive for abdominal distention.   Constitutional: negative for anorexia, fevers and sweats  Eyes: negative for irritation, redness and visual disturbance  Ears, nose, mouth, throat, and face: negative for earaches, epistaxis, nasal congestion and sore throat  Cardiovascular: negative for chest pain, dyspnea, lower extremity edema, orthopnea, palpitations and syncope  Genitourinary:negative for dysuria, frequency and hematuria  Hematologic/lymphatic: negative for bleeding, easy bruising and lymphadenopathy  Musculoskeletal:negative for arthralgias, muscle weakness and stiff joints  Neurological: negative for coordination problems, gait problems, headaches and weakness  Endocrine: negative for diabetic symptoms including  polydipsia, polyuria and weight loss      Objective:   Physical Exam  Gen. Pleasant, well-nourished, in no distress, normal affect ENT - no lesions, no post nasal drip Neck: No JVD, no thyromegaly, no carotid bruits Lungs: no use of accessory muscles, no dullness to percussion, clear without rales or rhonchi  Cardiovascular: Rhythm regular, heart sounds  normal, no murmurs or gallops, no peripheral edema Abdomen: soft and non-tender, no hepatosplenomegaly, BS normal. Musculoskeletal: No deformities, no cyanosis or clubbing Neuro:  alert, non focal        Assessment & Plan:

## 2014-06-19 ENCOUNTER — Other Ambulatory Visit: Payer: Self-pay

## 2014-06-19 MED ORDER — LOSARTAN POTASSIUM 100 MG PO TABS: 100.0000 mg | ORAL_TABLET | Freq: Every morning | ORAL | Status: DC

## 2014-06-25 ENCOUNTER — Encounter: Payer: Self-pay | Admitting: Physician Assistant

## 2014-06-25 ENCOUNTER — Ambulatory Visit (INDEPENDENT_AMBULATORY_CARE_PROVIDER_SITE_OTHER): Payer: Commercial Managed Care - PPO | Admitting: Physician Assistant

## 2014-06-25 VITALS — BP 124/62 | HR 66 | Ht 70.5 in | Wt 202.0 lb

## 2014-06-25 DIAGNOSIS — I2510 Atherosclerotic heart disease of native coronary artery without angina pectoris: Secondary | ICD-10-CM

## 2014-06-25 DIAGNOSIS — I10 Essential (primary) hypertension: Secondary | ICD-10-CM

## 2014-06-25 DIAGNOSIS — E785 Hyperlipidemia, unspecified: Secondary | ICD-10-CM

## 2014-06-25 NOTE — Progress Notes (Signed)
Cardiology Office Note    Date:  06/25/2014   ID:  Shane Walter, DOB Dec 07, 1946, MRN [id number]  PCP:  Unice Cobble, MD  Cardiologist:  Dr. Loralie Champagne      History of Present Illness: Shane Walter is a 68 y.o. Afghani male with a hx of CAD, s/p CABG 04/2008, DM2, HTN. Surgery was done at [facility]. He was previously followed at [facility]. Established with Dr. Loralie Champagne in 02/2012.  Last seen here in 05/2013.   Since last seen he has been doing well.  The patient denies chest pain, shortness of breath, syncope, orthopnea, PND or significant pedal edema.  His son is getting married in [place] soon.   Studies:  - Echo (10/12) with EF 55-60%, restrictive diastolic function, mild MR.  - Nuclear (05/2013):  No ischemia, EF 56%; Low Risk  - Carotid (10/12) with no significant plaque   Recent Labs: 05/15/2014: ALT 27; Creatinine 1.3; HDL Cholesterol by NMR 41.70; Hemoglobin 14.2; LDL (calc) 75; Potassium 4.9; TSH 1.55   Wt Readings from Last 3 Encounters:  06/25/14 202 lb (91.627 kg)  06/18/14 205 lb 6.4 oz (93.169 kg)  05/15/14 204 lb 9.6 oz (92.806 kg)     Past Medical History  Diagnosis Date  . Varicose veins   . Diverticulosis 2011    Dr Henrene Pastor  . Hyperlipidemia   . CAD (coronary artery disease)     a. 3-vessel CABG in 5/09 at [facility] after episode of unstable angina (L-LAD, RIMA-OM1, S-D1);  b.Echo (10/12) with EF 55-60%, restrictive diastolic function, mild MR.;  c.  Lexiscan Myoview 6/14:  Low risk, fixed inferolateral defect, no ischemia, EF 56% (of note ETT with ST depression but myoview normal)  . Gastritis     H. pylori +  . Colon polyp 2008     [facility]  . GERD (gastroesophageal reflux disease)   . Headache(784.0)     hx migraines  . Macular degeneration of right eye     Current Outpatient Prescriptions  Medication Sig Dispense Refill  . acetaminophen (TYLENOL) 500 MG tablet Take 500 mg by mouth every 6 (six) hours as needed for pain.       Marland Kitchen aspirin 81 MG tablet Take 81 mg by mouth daily.       . hyoscyamine (LEVSIN/SL) 0.125 MG SL tablet Place 1 tablet (0.125 mg total) under the tongue every 6 (six) hours as needed.  30 tablet  0  . losartan (COZAAR) 100 MG tablet Take 1 tablet (100 mg total) by mouth every morning.  30 tablet  5  . metoprolol succinate (TOPROL-XL) 25 MG 24 hr tablet TAKE 1 TABLET (25MG  TOTAL) BY MOUTH 2 (TWO) TIMES DAILY.  180 tablet  1  . Omega-3 Fatty Acids (FISH OIL) 1000 MG CAPS Take 1,000 mg by mouth daily.       . rosuvastatin (CRESTOR) 20 MG tablet Take 20 mg by mouth at bedtime.      . sildenafil (VIAGRA) 100 MG tablet 1/2-1 qd prn  3 tablet  0  . VITAMIN A PO Take 1,000 mg by mouth.      . [DISCONTINUED] montelukast (SINGULAIR) 10 MG tablet Take 1 tablet (10 mg total) by mouth at bedtime.  30 tablet  2   No current facility-administered medications for this visit.    Allergies:   Iohexol   Social History:  The patient  reports that he quit smoking about 38 years ago. His smoking  use included Cigarettes. He has a 34 pack-year smoking history. He has never used smokeless tobacco. He reports that he drinks alcohol. He reports that he does not use illicit drugs.   Family History:  The patient's family history includes Breast cancer in an other family member; Heart disease in his brother. There is no history of Heart attack, Stroke, Diabetes, or Colon cancer.   ROS:  Please see the history of present illness.    All other systems reviewed and negative.   PHYSICAL EXAM: VS:  BP 124/62  Pulse 66  Ht 5' 10.5" (1.791 m)  Wt 202 lb (91.627 kg)  BMI 28.56 kg/m2 Well nourished, well developed, in no acute distress HEENT: normal Neck: no JVD Vascular:  No carotid bruits Cardiac:  normal S1, S2; RRR; no murmur Lungs:  clear to auscultation bilaterally, no wheezing, rhonchi or rales Abd: soft, nontender, no hepatomegaly Ext: no edema Skin: warm and dry Neuro:  CNs 2-12 intact, no focal abnormalities  noted  EKG:  NSR, HR 66, normal axis, no acute changes.     ASSESSMENT AND PLAN:  1. CAD s/p CABG:  No angina.  Continue ASA, statin.   2. Essential hypertension, benign:  Controlled.  3. Other and unspecified hyperlipidemia:  Continue statin.  Recent LDL ok. 4. Disposition:  F/u with Dr. Loralie Champagne in 1 year.    Signed, Versie Starks, MHS 06/25/2014 2:30 PM    [facility] [address], [place], [place]  [address] Phone: [phone]; Fax: [phone]

## 2014-06-25 NOTE — Patient Instructions (Signed)
FOLLOW UP IN YEAR IN 1 YEAR WITH SCOTT WEAVER, PAC  NO CHANGES WERE MADE TODAY

## 2014-07-12 ENCOUNTER — Ambulatory Visit: Payer: Commercial Managed Care - PPO | Admitting: Cardiology

## 2014-07-30 DIAGNOSIS — G4733 Obstructive sleep apnea (adult) (pediatric): Secondary | ICD-10-CM

## 2014-07-31 ENCOUNTER — Other Ambulatory Visit: Payer: Self-pay | Admitting: Pulmonary Disease

## 2014-07-31 DIAGNOSIS — G4733 Obstructive sleep apnea (adult) (pediatric): Secondary | ICD-10-CM

## 2014-08-01 ENCOUNTER — Encounter: Payer: Self-pay | Admitting: Pulmonary Disease

## 2014-08-15 ENCOUNTER — Other Ambulatory Visit (INDEPENDENT_AMBULATORY_CARE_PROVIDER_SITE_OTHER): Payer: Commercial Managed Care - PPO

## 2014-08-15 ENCOUNTER — Ambulatory Visit (INDEPENDENT_AMBULATORY_CARE_PROVIDER_SITE_OTHER): Payer: Commercial Managed Care - PPO | Admitting: Internal Medicine

## 2014-08-15 ENCOUNTER — Encounter: Payer: Self-pay | Admitting: Internal Medicine

## 2014-08-15 VITALS — BP 132/80 | HR 52 | Temp 98.2°F | Wt 201.0 lb

## 2014-08-15 DIAGNOSIS — R109 Unspecified abdominal pain: Secondary | ICD-10-CM

## 2014-08-15 DIAGNOSIS — R1030 Lower abdominal pain, unspecified: Secondary | ICD-10-CM

## 2014-08-15 DIAGNOSIS — I2510 Atherosclerotic heart disease of native coronary artery without angina pectoris: Secondary | ICD-10-CM

## 2014-08-15 LAB — URINALYSIS
BILIRUBIN URINE: NEGATIVE
Ketones, ur: NEGATIVE
Leukocytes, UA: NEGATIVE
NITRITE: NEGATIVE
Specific Gravity, Urine: 1.015 (ref 1.000–1.030)
TOTAL PROTEIN, URINE-UPE24: NEGATIVE
Urine Glucose: NEGATIVE
Urobilinogen, UA: 0.2 (ref 0.0–1.0)
pH: 5.5 (ref 5.0–8.0)

## 2014-08-15 NOTE — Progress Notes (Signed)
Pre visit review using our clinic review tool, if applicable. No additional management support is needed unless otherwise documented below in the visit note. 

## 2014-08-15 NOTE — Progress Notes (Signed)
   Subjective:    Patient ID: Shane Walter, male    DOB: 02-17-1946, 68 y.o.   MRN: [id number]  HPI  Several days ago he began to have left peroneal area pain which radiated to the inguinal area. It subsequently moved to the right inguinal area and finally to the suprapubic area. It was also noted to radiate into the lumbosacral spine after that.  He had pain with urination but denied frank dysuria, pyuria, or hematuria  He also had no bowel changes  He changed his diet adding steamed rice and vegetables with resolution of the pain     Review of Systems   He describes tinnitus over several hours yesterday; this resolved without treatment  Also has had some painful lesions in the left scalp which have resolved.     Objective:   Physical Exam  Significant or distinguishing  findings on physical exam include: Varices in the left scrotum; no evidence of epididymitis or prostatitis. He does have a grade 1 systolic murmur. Left tympanic membrane is normal. No scalp lesions seen.  General appearance :adequately nourished; in no distress. Eyes: No conjunctival inflammation or scleral icterus is present. Oral exam: Dental hygiene is good. Lips and gums are healthy appearing.There is no oropharyngeal erythema or exudate noted.  Heart:  Normal rate and regular rhythm. S1 and S2 normal without gallop, click, rub or other extra sounds   Lungs:Chest clear to auscultation; no wheezes, rhonchi,rales ,or rubs present.No increased work of breathing.  Abdomen: bowel sounds normal, soft and non-tender without masses, organomegaly or hernias noted.  No guarding or rebound. No flank tenderness to percussion. Skin:Warm & dry.  Intact without suspicious lesions or rashes ; no jaundice or tenting Lymphatic: No lymphadenopathy is noted about the head, neck, axilla, or inguinal areas.             Assessment & Plan:  #1 perineal, or inguinal, suprapubic, and lumbosacral area pain which is  resolved with change in diet, mainly avoiding meat  #2 tinnitus resolved  #3 scalp lesions resolved  Plan: Urinalysis  Unsweetened cranberry juice and avoidance of triggers for cystitis

## 2014-08-15 NOTE — Patient Instructions (Signed)
Drink as much nondairy fluids as possible.  Take cranberry juice or tablets as per label .Avoid spicy foods or alcohol as  these may aggravate the bladder. Do not take decongestants. Avoid narcotics if possible.

## 2014-08-30 ENCOUNTER — Ambulatory Visit: Payer: Commercial Managed Care - PPO | Admitting: Cardiology

## 2014-09-06 ENCOUNTER — Ambulatory Visit (HOSPITAL_BASED_OUTPATIENT_CLINIC_OR_DEPARTMENT_OTHER): Payer: Commercial Managed Care - PPO | Attending: Pulmonary Disease | Admitting: Radiology

## 2014-09-06 VITALS — Ht 71.0 in | Wt 200.0 lb

## 2014-09-06 DIAGNOSIS — G4733 Obstructive sleep apnea (adult) (pediatric): Secondary | ICD-10-CM | POA: Diagnosis present

## 2014-09-06 DIAGNOSIS — G4761 Periodic limb movement disorder: Secondary | ICD-10-CM | POA: Insufficient documentation

## 2014-09-11 ENCOUNTER — Telehealth: Payer: Self-pay | Admitting: Pulmonary Disease

## 2014-09-11 DIAGNOSIS — G4710 Hypersomnia, unspecified: Secondary | ICD-10-CM

## 2014-09-11 DIAGNOSIS — G4730 Sleep apnea, unspecified: Secondary | ICD-10-CM

## 2014-09-11 DIAGNOSIS — G4733 Obstructive sleep apnea (adult) (pediatric): Secondary | ICD-10-CM

## 2014-09-11 NOTE — Sleep Study (Signed)
[place]  NAME: REUVEN BRAVER  DATE OF BIRTH: August 21, 1946  MEDICAL RECORD NUMBER [id number]  LOCATION: [facility]  PHYSICIAN: Jammi Morrissette V.  DATE OF STUDY: 09/06/14   SLEEP STUDY TYPE: CPAP titration study               REFERRING PHYSICIAN: Rigoberto Noel, MD  INDICATION FOR STUDY: 68 year old with moderate obstructive sleep apnea. Home sleep study showed AHI 23 events per hour with lowest desaturation of 79%. At the time of this study ,they weighed 200 pounds with a height of  5 ft 11 inches and the BMI of 28, neck size of 16 inches. Epworth sleepiness score was 3   This CPAP titration polysomnogram was performed with a sleep technologist in attendance. EEG, EOG,EMG and respiratory parameters recorded. Sleep stages, arousals, limb movements and respiratory data was scored according to criteria laid out by the American Academy of sleep medicine.  SLEEP ARCHITECTURE: Lights out was at 2141 PM and lights on was at 454 AM. Total sleep time was 417 minutes with sleep period time of 424 minutes and sleep efficiency of 96 .Sleep latency was 9 minutes with latency to REM sleep of 61 minutes and wake after sleep onset of 6 minutes.  Sleep stages as a percentage of total sleep time was N1 -2.3 %,N2- 71 % and REM sleep 27 % ( 113 minutes) . The longest period of REM sleep was around 2 AM.   AROUSAL DATA : There were 38 arousals with an arousal index of 5.5 events per hour. Of these 22 were spontaneous, and 0 were associated with respiratory events and 16 were associated periodic limb movements  RESPIRATORY DATA: CPAP was initiated at 5 centimeters and titrated to a final level of 7 centimeters due to respiratory events and snoring. At the final level of  7  centimeters, there were 0 obstructive apneas, 0 central apneas, 0 mixed apneas and 0 hypopneas with apnea -hypopnea index of 0 events per hour.  There was no relation to sleep stage or body position.  Titration was optimal.  MOVEMENT/PARASOMNIA: There were 154 PLMS with a PLM index of 22 events per hour. The PLM arousal index was 2.3 events per hour.  OXYGEN DATA: The lowest desaturation was 91 % during non-REM sleep and the desaturation index was 0.3 per hour. The saturations stayed below 88% for 0 minutes.  CARDIAC DATA: The low heart rate was 38 beats per minute. The high heart rate recorded was an artifact. No arrhythmias were noted   IMPRESSION :  1. moderate obstructive sleep apnea with hypopneas causing sleep fragmentation and mild oxygen desaturation. 2. This was corrected by CPAP of 7 centimeters with a medium fullface mask. Titration was optimal. 3. No evidence of cardiac arrhythmias or behavioral disturbance during sleep. 4. Periodic limb movements were noted but PLM arousals to not seem to be significant.  RECOMMENDATION:    1. The treatment options for this degree of sleep disordered breathing includes weight loss and/or CPAP therapy. CPAP can be initiated at 7 centimeters with a medium fullface mask and compliance monitored at this level. 2. Patient should be cautioned against driving when sleepy 3. They should be asked to avoid medications with sedative side effects  Rigoberto Noel  MD Diplomate, American Board of Sleep Medicine  ELECTRONICALLY SIGNED ON:  09/11/2014  [facility] PH: [phone]   FX: [phone]

## 2014-09-11 NOTE — Telephone Encounter (Signed)
Rx has been sent for CPAP 7 cm with medium fullface mask. Arrange for office visit with download in 6 weeks

## 2014-09-11 NOTE — Telephone Encounter (Signed)
Called pt. He reports he does not feel this is what he wants to do. He is requesting to speak with Dr. Elsworth Soho. Please advise thanks

## 2014-09-12 ENCOUNTER — Other Ambulatory Visit: Payer: Self-pay | Admitting: Internal Medicine

## 2014-09-27 NOTE — Telephone Encounter (Signed)
Called spoke with pt to schedule appt. He reports no one has contacted him to set up CPAP. He reports when he gets called to get this set up, he will call us for appt. Pt wants to be called on cell #. Please advise PCC's thanks

## 2014-09-27 NOTE — Telephone Encounter (Signed)
Spoke to [email or web address]  and she will call pt to proceed with cpap set up Joellen Jersey

## 2014-09-27 NOTE — Telephone Encounter (Signed)
I spoke to pt He is willing Pl ask DME to proceed. Arange Ov in 6 wks with download

## 2014-10-11 ENCOUNTER — Telehealth: Payer: Self-pay | Admitting: *Deleted

## 2014-10-11 NOTE — Telephone Encounter (Signed)
Called pt to make appt to follow up on CPAP. He reports he has not been set up yet. He reports he never heard from [place]. I called [facility] and spoke w/ Daneil Dan. She reports they faxed a CMN to be signed for pt to be set up on 10/13. She reports this has to be signed before set up can be done. Alida had signed CMN. I have faxed this over to [place]. I called [employer] and spoke with Rodena Piety. She is going to call pt now to schedule set up date. She is going to call me back once this is scheduled. Will await call.

## 2014-10-13 ENCOUNTER — Other Ambulatory Visit: Payer: Self-pay | Admitting: Internal Medicine

## 2014-10-19 NOTE — Telephone Encounter (Signed)
Called [facility] and was advised pt was set up on 10/18/14.  Called pt and LMTCB x1 to schedule OV

## 2014-10-23 ENCOUNTER — Other Ambulatory Visit: Payer: Self-pay | Admitting: Internal Medicine

## 2014-10-24 NOTE — Telephone Encounter (Signed)
Called made pt aware of below. He reports he understands but did not want to schedule appt at this time. He reports he will still call back to schedule. Will sign off

## 2014-10-24 NOTE — Telephone Encounter (Signed)
Pl let him know he needs 30-90 day visit

## 2014-10-24 NOTE — Telephone Encounter (Signed)
Spoke with pt. He reports he did receive CPAP. He reports he is so busy and not able to schedule an appt at this time. He reports he will call back to schedule appt when he can.

## 2014-11-02 ENCOUNTER — Other Ambulatory Visit: Payer: Self-pay | Admitting: Internal Medicine

## 2014-11-22 ENCOUNTER — Telehealth: Payer: Self-pay | Admitting: Pulmonary Disease

## 2014-11-22 NOTE — Telephone Encounter (Signed)
lmomtcb x1 

## 2014-11-23 NOTE — Telephone Encounter (Signed)
I spoke with the pt andhe is moving to [place] in the next week. He will establish with a doctor there. No appt needed. Odin Bing, CMA

## 2014-11-28 ENCOUNTER — Ambulatory Visit (INDEPENDENT_AMBULATORY_CARE_PROVIDER_SITE_OTHER): Payer: Commercial Managed Care - PPO | Admitting: Internal Medicine

## 2014-11-28 ENCOUNTER — Other Ambulatory Visit (INDEPENDENT_AMBULATORY_CARE_PROVIDER_SITE_OTHER): Payer: Commercial Managed Care - PPO

## 2014-11-28 ENCOUNTER — Encounter: Payer: Self-pay | Admitting: Internal Medicine

## 2014-11-28 VITALS — BP 94/60 | HR 56 | Temp 98.1°F | Wt 203.1 lb

## 2014-11-28 DIAGNOSIS — R1032 Left lower quadrant pain: Secondary | ICD-10-CM

## 2014-11-28 DIAGNOSIS — I2510 Atherosclerotic heart disease of native coronary artery without angina pectoris: Secondary | ICD-10-CM

## 2014-11-28 DIAGNOSIS — R1030 Lower abdominal pain, unspecified: Secondary | ICD-10-CM

## 2014-11-28 DIAGNOSIS — Z8719 Personal history of other diseases of the digestive system: Secondary | ICD-10-CM

## 2014-11-28 LAB — CBC WITH DIFFERENTIAL/PLATELET
BASOS PCT: 0.6 % (ref 0.0–3.0)
Basophils Absolute: 0 10*3/uL (ref 0.0–0.1)
EOS ABS: 0.3 10*3/uL (ref 0.0–0.7)
EOS PCT: 3.6 % (ref 0.0–5.0)
HCT: 40.9 % (ref 39.0–52.0)
Hemoglobin: 13.7 g/dL (ref 13.0–17.0)
Lymphocytes Relative: 34.1 % (ref 12.0–46.0)
Lymphs Abs: 2.5 10*3/uL (ref 0.7–4.0)
MCHC: 33.5 g/dL (ref 30.0–36.0)
MCV: 85.8 fl (ref 78.0–100.0)
MONO ABS: 0.7 10*3/uL (ref 0.1–1.0)
Monocytes Relative: 9.2 % (ref 3.0–12.0)
NEUTROS ABS: 3.8 10*3/uL (ref 1.4–7.7)
NEUTROS PCT: 52.5 % (ref 43.0–77.0)
Platelets: 204 10*3/uL (ref 150.0–400.0)
RBC: 4.77 Mil/uL (ref 4.22–5.81)
RDW: 12.9 % (ref 11.5–15.5)
WBC: 7.2 10*3/uL (ref 4.0–10.5)

## 2014-11-28 LAB — URINALYSIS
Bilirubin Urine: NEGATIVE
Leukocytes, UA: NEGATIVE
Nitrite: NEGATIVE
Specific Gravity, Urine: >=1.03 — AB (ref 1.000–1.030)
TOTAL PROTEIN, URINE-UPE24: NEGATIVE
URINE GLUCOSE: NEGATIVE
Urobilinogen, UA: 0.2 (ref 0.0–1.0)
pH: 5.5 (ref 5.0–8.0)

## 2014-11-28 MED ORDER — RANITIDINE HCL 150 MG PO TABS: 150.0000 mg | ORAL_TABLET | Freq: Two times a day (BID) | ORAL | Status: AC

## 2014-11-28 NOTE — Progress Notes (Signed)
   Subjective:    Patient ID: Shane Walter, male    DOB: 25-Mar-1946, 68 y.o.   MRN: [id number]  HPI   He has had chronic intermittent left lower quadrant pain which he describes as dull for several years.  He was having these symptoms when he had colonoscopy and endoscopy 07/15/11. Upper endoscopy did show mild esophagitis.  Colonoscopy revealed a diminutive polyp in the ascending colon which proved to be a tubular adenoma. Follow-up in 5 years was recommended. He also had diverticulosis without evidence of diverticulitis in the sigmoid colon  He states that this discomfort improves if he drinks cold water or eats.  Yesterday he did have suprapubic dull pain as well.  Occasionally he has some back pain which radiates anteriorly.  He stopped his 81 mg of aspirin 5-6 weeks ago without improvement in symptoms  He does ingest some caffeine.   Review of Systems    He denies dysphagia, significant dyspepsia, anorexia, hematemesis, weight change,melena, rectal bleeding,fever, chills, sweats, dysuria, pyuria, hematuria.       Objective:   Physical Exam General appearance is one of good health and nourishment w/o distress.  Eyes: No conjunctival inflammation or scleral icterus is present.  Oral exam: Dental hygiene is good; lips and gums are healthy appearing.There is no oropharyngeal erythema or exudate noted.   Heart:  Normal rate and regular rhythm. S1 and S2 normal without gallop, murmur, click, rub or other extra sounds     Lungs:Chest clear to auscultation; no wheezes, rhonchi,rales ,or rubs present.No increased work of breathing.   Abdomen: bowel sounds normal, soft and non-tender without masses, organomegaly or hernias noted.  No guarding or rebound . No tenderness over the flanks to percussion  Musculoskeletal: Able to lie flat and sit up without help. Negative straight leg raising bilaterally. Gait normal  Skin:Warm & dry.  Intact without suspicious lesions or rashes ;  no jaundice or tenting. Keloid of sternal operative scar.  Lymphatic: No lymphadenopathy is noted about the head, neck, axilla.                Assessment & Plan:  #1 chronic left lower quadrant pain; ? referred pain  #2 Past history of diminutive tubular adenoma  #3 past history of diverticulosis  #4 suprapubic dull pain 12/8  Plan: Ranitidine twice a day with antireflux measures  CBC and differential. If symptoms persist GI referral recommended.

## 2014-11-28 NOTE — Progress Notes (Signed)
Pre visit review using our clinic review tool, if applicable. No additional management support is needed unless otherwise documented below in the visit note. 

## 2014-11-28 NOTE — Patient Instructions (Signed)
Reflux of gastric acid may be asymptomatic as this may occur mainly during sleep.The triggers for reflux  include stress; the "aspirin family" ; alcohol; peppermint; and caffeine (coffee, tea, cola, and chocolate). The aspirin family would include aspirin and the nonsteroidal agents such as ibuprofen &  Naproxen. Tylenol would not cause reflux. If having symptoms ; food & drink should be avoided for @ least 2 hours before going to bed.  Take the ranitidine 30 minutes before breakfast and evening meal. If this does not control reflux symptoms;start omeprazole (Prilosec OTC) 20 mg  30 minutes before breakfast in place of the ranitidine.

## 2014-11-29 ENCOUNTER — Other Ambulatory Visit: Payer: Self-pay | Admitting: Internal Medicine

## 2014-11-29 DIAGNOSIS — R1032 Left lower quadrant pain: Secondary | ICD-10-CM

## 2014-11-29 DIAGNOSIS — R3129 Other microscopic hematuria: Secondary | ICD-10-CM

## 2014-11-29 DIAGNOSIS — R1030 Lower abdominal pain, unspecified: Secondary | ICD-10-CM

## 2014-11-30 ENCOUNTER — Telehealth: Payer: Self-pay

## 2014-11-30 MED ORDER — ROSUVASTATIN CALCIUM 20 MG PO TABS: 20.0000 mg | ORAL_TABLET | Freq: Every day | ORAL | Status: AC

## 2014-11-30 MED ORDER — LOSARTAN POTASSIUM 100 MG PO TABS: 100.0000 mg | ORAL_TABLET | Freq: Every morning | ORAL | Status: AC

## 2014-11-30 MED ORDER — METOPROLOL SUCCINATE ER 25 MG PO TB24: ORAL_TABLET | ORAL | Status: AC

## 2014-11-30 NOTE — Telephone Encounter (Signed)
Shane Walter [phone] (M) [phone] [name])  [name] called with a new cell number to call him at with his lab results

## 2014-11-30 NOTE — Telephone Encounter (Signed)
Patient has been advised of lab results and urology referral. He also requested a refill of his medication be sent to [facility] on [address]

## 2014-11-30 NOTE — Telephone Encounter (Signed)
This is Dr. Darrol Angel Pt. Please send to his assistant, not sure who. Thanks.

## 2015-01-10 ENCOUNTER — Telehealth: Payer: Self-pay | Admitting: *Deleted

## 2015-01-10 NOTE — Telephone Encounter (Signed)
Pt had moved to [place] & no longer with PCP.

## 2015-01-11 ENCOUNTER — Encounter: Payer: Self-pay | Admitting: Internal Medicine

## 2015-06-17 ENCOUNTER — Other Ambulatory Visit: Payer: Self-pay

## 2015-09-23 IMAGING — CR DG CHEST 2V
2 series · 2 of 2 positions shown · non-contrast
Comparison: DG CHEST 2 VIEW dated 06/08/2013

CLINICAL DATA: Left upper quadrant and left-sided chest discomfort.

EXAM:
CHEST  2 VIEW

[view not recorded (1 of 2)]
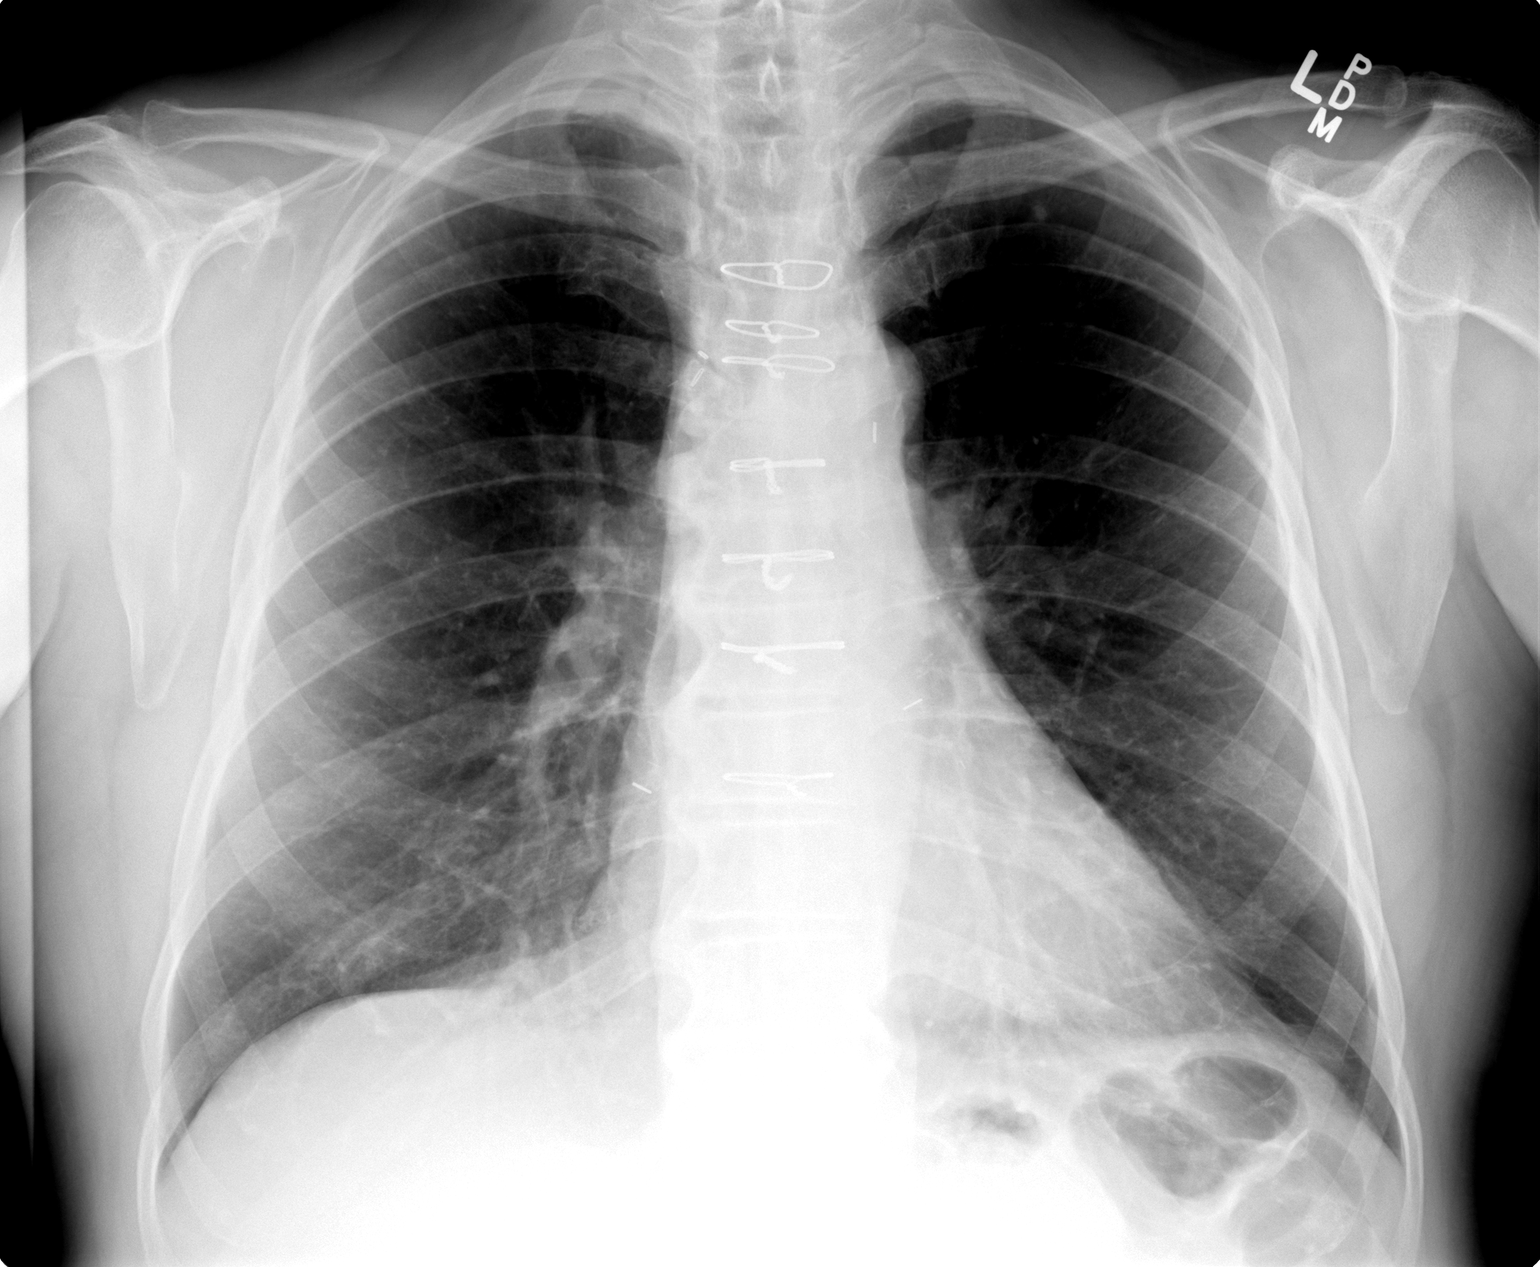

[view not recorded (2 of 2)]
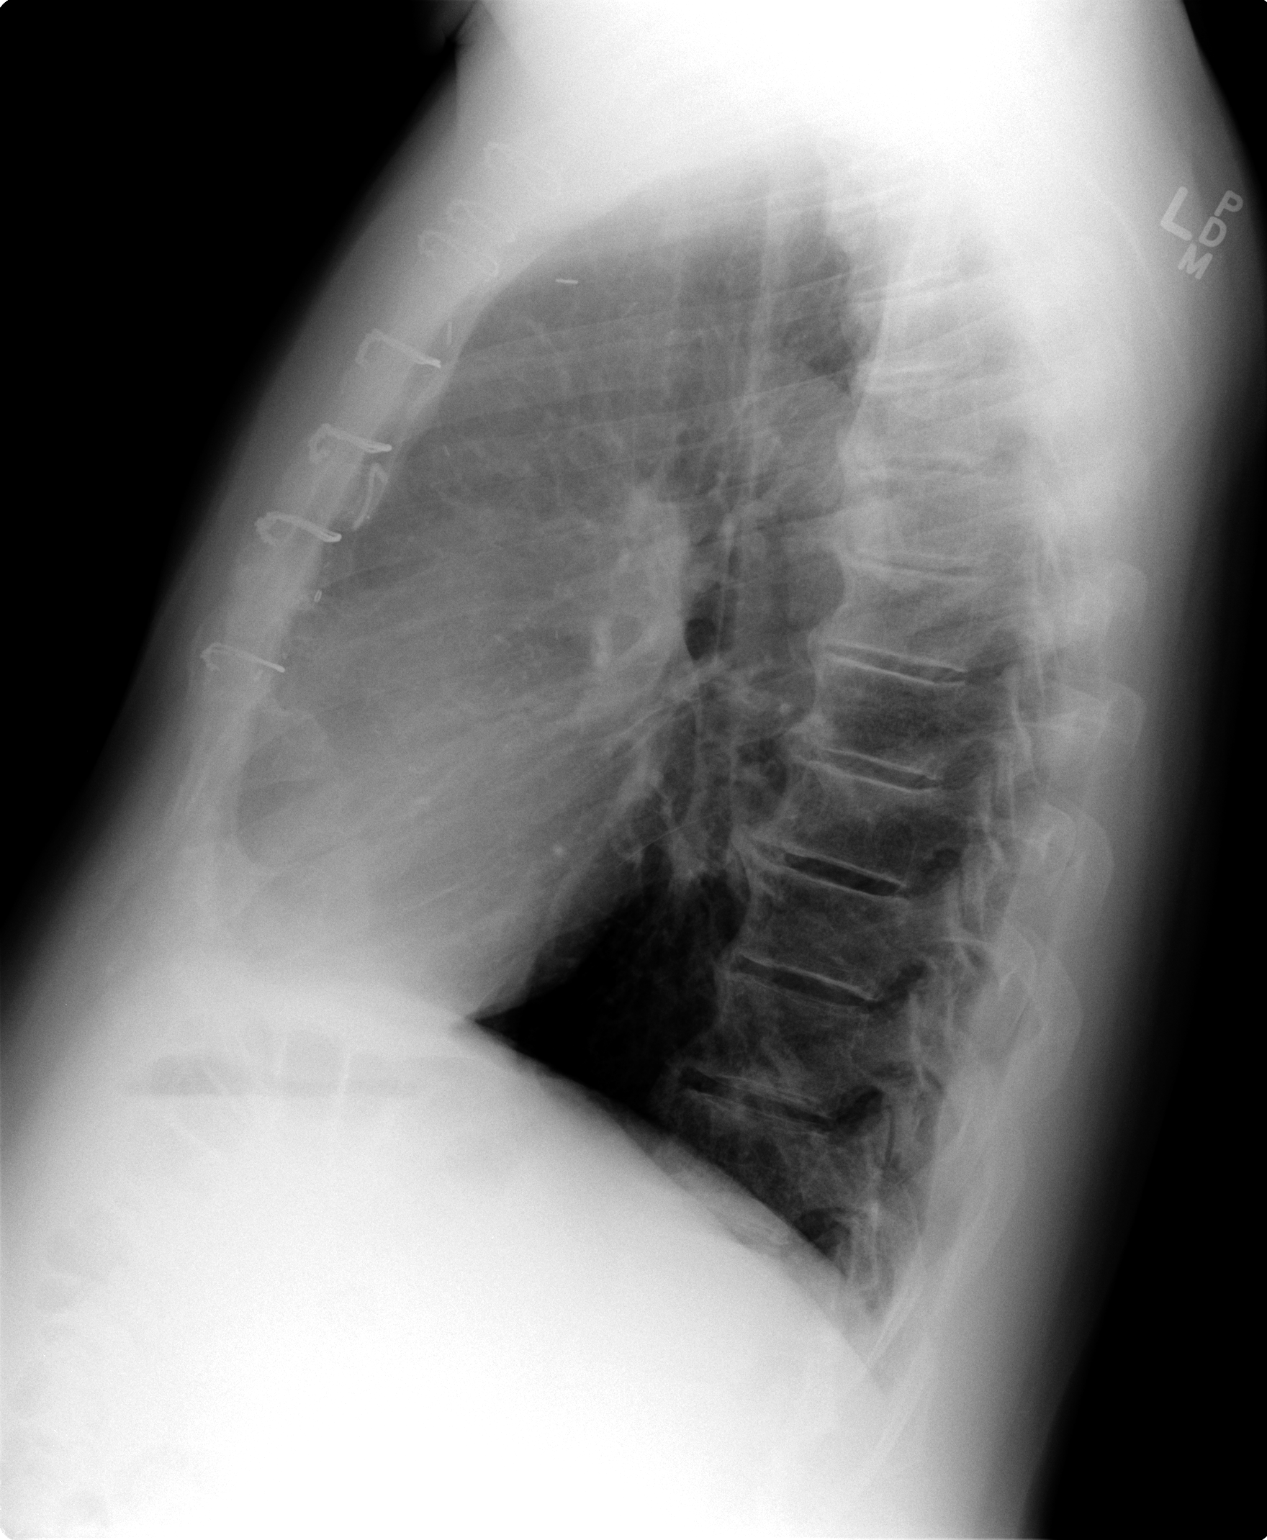

[2 of 2 positions shown; findings below may reference images not displayed]

FINDINGS: The lungs are mildly hyperinflated and clear. There is a stable 4 mm
diameter calcified nodule in the left upper lobe. The cardiac
silhouette is normal in size. The patient has undergone previous
median sternotomy. There is no pleural effusion or pneumothorax.
Mild degenerative disc change of the thoracic spine is present. The
gas pattern in the visualized portions of the upper abdomen is
within the limits of normal.
IMPRESSION: There is mild hyperinflation consistent with COPD. There is no
evidence of pneumonia nor CHF or other acute cardiopulmonary
abnormality.

## 2015-10-10 ENCOUNTER — Telehealth: Payer: Self-pay

## 2015-10-10 NOTE — Telephone Encounter (Signed)
Call to the patient with [place] address and asked about AWV if he is in . Stated he has just moved to [place] and misses work and [place]. Wished him well.
